# Patient Record
Sex: Female | Born: 1940 | Race: White | Hispanic: No | Marital: Married | Smoking: Never smoker
Health system: Southern US, Community
[De-identification: ages and names within clinical notes are randomized; demographics above are authoritative.]

## PROBLEM LIST (undated history)

## (undated) DIAGNOSIS — F329 Major depressive disorder, single episode, unspecified: Secondary | ICD-10-CM

## (undated) DIAGNOSIS — M1990 Unspecified osteoarthritis, unspecified site: Secondary | ICD-10-CM

## (undated) DIAGNOSIS — F32A Depression, unspecified: Secondary | ICD-10-CM

## (undated) HISTORY — PX: BREAST SURGERY: SHX581

## (undated) HISTORY — PX: TONSILLECTOMY: SUR1361

## (undated) HISTORY — DX: Major depressive disorder, single episode, unspecified: F32.9

## (undated) HISTORY — DX: Depression, unspecified: F32.A

## (undated) HISTORY — DX: Unspecified osteoarthritis, unspecified site: M19.90

---

## 2005-08-28 ENCOUNTER — Ambulatory Visit: Payer: Self-pay | Admitting: Internal Medicine

## 2005-11-02 ENCOUNTER — Ambulatory Visit: Payer: Self-pay | Admitting: Internal Medicine

## 2005-11-09 ENCOUNTER — Ambulatory Visit: Payer: Self-pay | Admitting: Internal Medicine

## 2005-11-09 ENCOUNTER — Other Ambulatory Visit: Admission: RE | Admit: 2005-11-09 | Discharge: 2005-11-09 | Payer: Self-pay | Admitting: Internal Medicine

## 2005-11-09 ENCOUNTER — Encounter: Payer: Self-pay | Admitting: Internal Medicine

## 2005-11-09 ENCOUNTER — Ambulatory Visit: Payer: Self-pay | Admitting: Oncology

## 2005-11-27 ENCOUNTER — Ambulatory Visit: Payer: Self-pay | Admitting: Internal Medicine

## 2005-12-27 ENCOUNTER — Ambulatory Visit: Payer: Self-pay | Admitting: Oncology

## 2006-01-25 ENCOUNTER — Ambulatory Visit (HOSPITAL_COMMUNITY): Admission: RE | Admit: 2006-01-25 | Discharge: 2006-01-25 | Payer: Self-pay | Admitting: Oncology

## 2006-03-01 ENCOUNTER — Ambulatory Visit: Payer: Self-pay | Admitting: Oncology

## 2006-03-15 LAB — CBC WITH DIFFERENTIAL/PLATELET
BASO%: 1.8 % (ref 0.0–2.0)
Basophils Absolute: 0 10*3/uL (ref 0.0–0.1)
EOS%: 3.9 % (ref 0.0–7.0)
HCT: 35.6 % (ref 34.8–46.6)
MCHC: 35.7 g/dL (ref 32.0–36.0)
MCV: 85.3 fL (ref 81.0–101.0)
MONO#: 0.4 10*3/uL (ref 0.1–0.9)
NEUT#: 0.2 10*3/uL — CL (ref 1.5–6.5)
NEUT%: 13 % — ABNORMAL LOW (ref 39.6–76.8)
Platelets: 158 10*3/uL (ref 145–400)
RDW: 13 % (ref 11.3–14.5)
lymph#: 0.9 10*3/uL (ref 0.9–3.3)

## 2006-04-02 LAB — CBC WITH DIFFERENTIAL/PLATELET
BASO%: 0.5 % (ref 0.0–2.0)
EOS%: 1.5 % (ref 0.0–7.0)
HGB: 13.2 g/dL (ref 11.6–15.9)
MCH: 29.5 pg (ref 26.0–34.0)
MCHC: 34.6 g/dL (ref 32.0–36.0)
MCV: 85.1 fL (ref 81.0–101.0)
MONO%: 23.3 % — ABNORMAL HIGH (ref 0.0–13.0)
RBC: 4.47 10*6/uL (ref 3.70–5.32)
RDW: 13.5 % (ref 11.3–14.5)
lymph#: 1.4 10*3/uL (ref 0.9–3.3)

## 2006-04-04 LAB — CMV ABS, IGG+IGM (CYTOMEGALOVIRUS): Cytomegalovirus Ab-IgG: 0.16 IV

## 2006-04-04 LAB — EPSTEIN-BARR VIRUS VCA, IGM: EBV VCA IgM: 0.32 {ISR}

## 2006-04-04 LAB — EPSTEIN-BARR VIRUS VCA, IGG: EBV VCA IgG: 5.34 {ISR} — ABNORMAL HIGH

## 2006-04-30 ENCOUNTER — Ambulatory Visit: Payer: Self-pay | Admitting: Oncology

## 2006-05-02 LAB — CBC WITH DIFFERENTIAL/PLATELET
MCHC: 34.5 g/dL (ref 32.0–36.0)
MONO#: 0.4 10*3/uL (ref 0.1–0.9)
Platelets: 186 10*3/uL (ref 145–400)
WBC: 1.8 10*3/uL — ABNORMAL LOW (ref 3.9–10.0)

## 2006-06-07 LAB — CBC WITH DIFFERENTIAL/PLATELET
BASO%: 0.8 % (ref 0.0–2.0)
EOS%: 2.3 % (ref 0.0–7.0)
HGB: 13.3 g/dL (ref 11.6–15.9)
LYMPH%: 66.5 % — ABNORMAL HIGH (ref 14.0–48.0)
MONO%: 23.2 % — ABNORMAL HIGH (ref 0.0–13.0)
Platelets: 187 10*3/uL (ref 145–400)
RBC: 4.5 10*6/uL (ref 3.70–5.32)
lymph#: 1.4 10*3/uL (ref 0.9–3.3)

## 2006-08-15 ENCOUNTER — Ambulatory Visit: Payer: Self-pay | Admitting: Oncology

## 2006-08-16 LAB — CBC WITH DIFFERENTIAL/PLATELET
HCT: 36.9 % (ref 34.8–46.6)
HGB: 12.9 g/dL (ref 11.6–15.9)
MCH: 30.2 pg (ref 26.0–34.0)
MCV: 86.6 fL (ref 81.0–101.0)

## 2006-08-16 LAB — MANUAL DIFFERENTIAL
ALC: 1.3 10*3/uL (ref 0.9–3.3)
LYMPH: 73 % — ABNORMAL HIGH (ref 14–49)
MONO: 13 % (ref 0–14)
PLT EST: ADEQUATE
RBC Comments: NORMAL
SEG: 14 % — ABNORMAL LOW (ref 38–77)

## 2006-09-19 ENCOUNTER — Ambulatory Visit: Payer: Self-pay | Admitting: Internal Medicine

## 2007-01-06 ENCOUNTER — Ambulatory Visit: Payer: Self-pay | Admitting: Oncology

## 2007-01-09 ENCOUNTER — Encounter: Payer: Self-pay | Admitting: Internal Medicine

## 2007-01-09 LAB — CBC WITH DIFFERENTIAL/PLATELET
Basophils Absolute: 0 10*3/uL (ref 0.0–0.1)
Eosinophils Absolute: 0 10*3/uL (ref 0.0–0.5)
HGB: 13.3 g/dL (ref 11.6–15.9)
MCV: 86.2 fL (ref 81.0–101.0)
MONO%: 20.9 % — ABNORMAL HIGH (ref 0.0–13.0)
NEUT#: 0.1 10*3/uL — CL (ref 1.5–6.5)
RBC: 4.39 10*6/uL (ref 3.70–5.32)
RDW: 12.9 % (ref 11.3–14.5)
WBC: 1.6 10*3/uL — ABNORMAL LOW (ref 3.9–10.0)
lymph#: 1.1 10*3/uL (ref 0.9–3.3)

## 2007-01-09 LAB — COMPREHENSIVE METABOLIC PANEL
ALT: 19 U/L (ref 0–35)
Albumin: 4 g/dL (ref 3.5–5.2)
Alkaline Phosphatase: 50 U/L (ref 39–117)
CO2: 26 mEq/L (ref 19–32)
Potassium: 3.9 mEq/L (ref 3.5–5.3)
Sodium: 142 mEq/L (ref 135–145)
Total Bilirubin: 0.4 mg/dL (ref 0.3–1.2)
Total Protein: 7.4 g/dL (ref 6.0–8.3)

## 2007-01-09 LAB — CHCC SMEAR

## 2007-07-02 ENCOUNTER — Ambulatory Visit: Payer: Self-pay | Admitting: Oncology

## 2007-07-04 ENCOUNTER — Encounter: Payer: Self-pay | Admitting: Internal Medicine

## 2007-07-04 LAB — CBC WITH DIFFERENTIAL/PLATELET
BASO%: 0.7 % (ref 0.0–2.0)
LYMPH%: 61.2 % — ABNORMAL HIGH (ref 14.0–48.0)
MCHC: 35.8 g/dL (ref 32.0–36.0)
MCV: 84.1 fL (ref 81.0–101.0)
MONO%: 21 % — ABNORMAL HIGH (ref 0.0–13.0)
Platelets: 168 10*3/uL (ref 145–400)
RBC: 4.07 10*6/uL (ref 3.70–5.32)
RDW: 12.8 % (ref 11.3–14.5)
WBC: 2 10*3/uL — ABNORMAL LOW (ref 3.9–10.0)

## 2007-07-04 LAB — COMPREHENSIVE METABOLIC PANEL
ALT: 13 U/L (ref 0–35)
AST: 18 U/L (ref 0–37)
Alkaline Phosphatase: 59 U/L (ref 39–117)
Potassium: 3.9 mEq/L (ref 3.5–5.3)
Sodium: 143 mEq/L (ref 135–145)
Total Bilirubin: 0.4 mg/dL (ref 0.3–1.2)
Total Protein: 7.1 g/dL (ref 6.0–8.3)

## 2007-07-04 LAB — LACTATE DEHYDROGENASE: LDH: 128 U/L (ref 94–250)

## 2007-07-23 DIAGNOSIS — F329 Major depressive disorder, single episode, unspecified: Secondary | ICD-10-CM

## 2007-10-01 ENCOUNTER — Ambulatory Visit: Payer: Self-pay | Admitting: Internal Medicine

## 2007-10-01 ENCOUNTER — Telehealth (INDEPENDENT_AMBULATORY_CARE_PROVIDER_SITE_OTHER): Payer: Self-pay | Admitting: *Deleted

## 2007-10-03 ENCOUNTER — Ambulatory Visit: Payer: Self-pay | Admitting: Internal Medicine

## 2007-10-03 DIAGNOSIS — M129 Arthropathy, unspecified: Secondary | ICD-10-CM | POA: Insufficient documentation

## 2007-10-03 DIAGNOSIS — D709 Neutropenia, unspecified: Secondary | ICD-10-CM

## 2007-10-03 DIAGNOSIS — F411 Generalized anxiety disorder: Secondary | ICD-10-CM

## 2007-10-31 ENCOUNTER — Telehealth (INDEPENDENT_AMBULATORY_CARE_PROVIDER_SITE_OTHER): Payer: Self-pay | Admitting: *Deleted

## 2008-01-06 ENCOUNTER — Ambulatory Visit: Payer: Self-pay | Admitting: Oncology

## 2008-01-08 ENCOUNTER — Encounter: Payer: Self-pay | Admitting: Internal Medicine

## 2008-01-08 LAB — CBC WITH DIFFERENTIAL/PLATELET
BASO%: 1.3 % (ref 0.0–2.0)
EOS%: 1.1 % (ref 0.0–7.0)
HCT: 36 % (ref 34.8–46.6)
MCH: 28.9 pg (ref 26.0–34.0)
MCHC: 34.8 g/dL (ref 32.0–36.0)
NEUT%: 5.7 % — ABNORMAL LOW (ref 39.6–76.8)
lymph#: 1.2 10*3/uL (ref 0.9–3.3)

## 2008-01-08 LAB — CHCC SMEAR

## 2008-06-29 ENCOUNTER — Ambulatory Visit: Payer: Self-pay | Admitting: Oncology

## 2008-07-01 ENCOUNTER — Encounter: Payer: Self-pay | Admitting: Internal Medicine

## 2008-07-01 LAB — CBC WITH DIFFERENTIAL/PLATELET
BASO%: 1.8 % (ref 0.0–2.0)
EOS%: 1.8 % (ref 0.0–7.0)
Eosinophils Absolute: 0 10*3/uL (ref 0.0–0.5)
HCT: 34.7 % — ABNORMAL LOW (ref 34.8–46.6)
HGB: 12.1 g/dL (ref 11.6–15.9)
LYMPH%: 62.6 % — ABNORMAL HIGH (ref 14.0–48.0)
MCV: 85 fL (ref 81.0–101.0)
NEUT#: 0.2 10*3/uL — CL (ref 1.5–6.5)
RDW: 12.2 % (ref 11.3–14.5)
lymph#: 1.1 10*3/uL (ref 0.9–3.3)

## 2008-07-01 LAB — COMPREHENSIVE METABOLIC PANEL
ALT: 21 U/L (ref 0–35)
AST: 22 U/L (ref 0–37)
Albumin: 3.9 g/dL (ref 3.5–5.2)
CO2: 24 mEq/L (ref 19–32)
Calcium: 9.1 mg/dL (ref 8.4–10.5)

## 2008-08-11 ENCOUNTER — Telehealth: Payer: Self-pay | Admitting: Internal Medicine

## 2008-08-12 ENCOUNTER — Encounter: Payer: Self-pay | Admitting: Internal Medicine

## 2009-01-04 ENCOUNTER — Ambulatory Visit: Payer: Self-pay | Admitting: Oncology

## 2009-01-06 ENCOUNTER — Encounter: Payer: Self-pay | Admitting: Internal Medicine

## 2009-01-06 LAB — COMPREHENSIVE METABOLIC PANEL
ALT: 22 U/L (ref 0–35)
BUN: 21 mg/dL (ref 6–23)
CO2: 29 mEq/L (ref 19–32)
Chloride: 101 mEq/L (ref 96–112)
Creatinine, Ser: 0.97 mg/dL (ref 0.40–1.20)
Sodium: 135 mEq/L (ref 135–145)
Total Protein: 7.1 g/dL (ref 6.0–8.3)

## 2009-01-06 LAB — CBC WITH DIFFERENTIAL/PLATELET
Basophils Absolute: 0 10*3/uL (ref 0.0–0.1)
HGB: 12.1 g/dL (ref 11.6–15.9)
LYMPH%: 64.5 % — ABNORMAL HIGH (ref 14.0–48.0)
MCHC: 35.6 g/dL (ref 32.0–36.0)
MONO#: 0.3 10*3/uL (ref 0.1–0.9)
NEUT%: 11.8 % — ABNORMAL LOW (ref 39.6–76.8)
Platelets: 149 10*3/uL (ref 145–400)
WBC: 1.5 10*3/uL — ABNORMAL LOW (ref 3.9–10.0)
lymph#: 1 10*3/uL (ref 0.9–3.3)

## 2009-01-11 ENCOUNTER — Ambulatory Visit: Payer: Self-pay | Admitting: Internal Medicine

## 2009-01-11 DIAGNOSIS — M25519 Pain in unspecified shoulder: Secondary | ICD-10-CM

## 2009-01-19 ENCOUNTER — Encounter: Payer: Self-pay | Admitting: Internal Medicine

## 2009-02-04 ENCOUNTER — Telehealth: Payer: Self-pay | Admitting: Family Medicine

## 2009-02-24 ENCOUNTER — Encounter: Payer: Self-pay | Admitting: Internal Medicine

## 2009-04-07 ENCOUNTER — Encounter: Payer: Self-pay | Admitting: Internal Medicine

## 2009-08-09 ENCOUNTER — Encounter: Payer: Self-pay | Admitting: Internal Medicine

## 2009-08-09 ENCOUNTER — Encounter (INDEPENDENT_AMBULATORY_CARE_PROVIDER_SITE_OTHER): Payer: Self-pay | Admitting: *Deleted

## 2009-11-18 ENCOUNTER — Encounter (INDEPENDENT_AMBULATORY_CARE_PROVIDER_SITE_OTHER): Payer: Self-pay | Admitting: *Deleted

## 2010-01-03 ENCOUNTER — Ambulatory Visit: Payer: Self-pay | Admitting: Oncology

## 2010-01-05 ENCOUNTER — Encounter: Payer: Self-pay | Admitting: Internal Medicine

## 2010-01-05 LAB — CBC WITH DIFFERENTIAL/PLATELET
BASO%: 1 % (ref 0.0–2.0)
Basophils Absolute: 0 10*3/uL (ref 0.0–0.1)
HCT: 34.3 % — ABNORMAL LOW (ref 34.8–46.6)
HGB: 11.8 g/dL (ref 11.6–15.9)
LYMPH%: 63.2 % — ABNORMAL HIGH (ref 14.0–49.7)
MCH: 29.7 pg (ref 25.1–34.0)
MCV: 86.2 fL (ref 79.5–101.0)
MONO%: 25.2 % — ABNORMAL HIGH (ref 0.0–14.0)
NEUT#: 0.1 10*3/uL — CL (ref 1.5–6.5)
NEUT%: 8.2 % — ABNORMAL LOW (ref 38.4–76.8)
RDW: 14.1 % (ref 11.2–14.5)
WBC: 1.2 10*3/uL — ABNORMAL LOW (ref 3.9–10.3)

## 2010-01-05 LAB — COMPREHENSIVE METABOLIC PANEL
ALT: 13 U/L (ref 0–35)
AST: 15 U/L (ref 0–37)
Alkaline Phosphatase: 53 U/L (ref 39–117)
BUN: 20 mg/dL (ref 6–23)
Glucose, Bld: 78 mg/dL (ref 70–99)
Sodium: 140 mEq/L (ref 135–145)
Total Bilirubin: 0.3 mg/dL (ref 0.3–1.2)

## 2010-01-05 LAB — VITAMIN D 25 HYDROXY (VIT D DEFICIENCY, FRACTURES): Vit D, 25-Hydroxy: 43 ng/mL (ref 30–89)

## 2010-01-05 LAB — LACTATE DEHYDROGENASE: LDH: 127 U/L (ref 94–250)

## 2010-01-20 ENCOUNTER — Ambulatory Visit: Payer: Self-pay | Admitting: Internal Medicine

## 2010-03-28 ENCOUNTER — Ambulatory Visit: Payer: Self-pay | Admitting: Oncology

## 2010-03-29 ENCOUNTER — Encounter: Payer: Self-pay | Admitting: Internal Medicine

## 2010-03-29 LAB — CBC WITH DIFFERENTIAL/PLATELET
Eosinophils Absolute: 0 10*3/uL (ref 0.0–0.5)
HCT: 33.4 % — ABNORMAL LOW (ref 34.8–46.6)
MCH: 29.5 pg (ref 25.1–34.0)
MCHC: 34.6 g/dL (ref 31.5–36.0)
MCV: 85.3 fL (ref 79.5–101.0)
MONO#: 0.3 10*3/uL (ref 0.1–0.9)
MONO%: 31.1 % — ABNORMAL HIGH (ref 0.0–14.0)
RBC: 3.92 10*6/uL (ref 3.70–5.45)
RDW: 14 % (ref 11.2–14.5)
WBC: 0.9 10*3/uL — CL (ref 3.9–10.3)
lymph#: 0.5 10*3/uL — ABNORMAL LOW (ref 0.9–3.3)

## 2010-03-29 LAB — COMPREHENSIVE METABOLIC PANEL
Albumin: 3.5 g/dL (ref 3.5–5.2)
Alkaline Phosphatase: 50 U/L (ref 39–117)
BUN: 17 mg/dL (ref 6–23)
CO2: 29 mEq/L (ref 19–32)
Chloride: 102 mEq/L (ref 96–112)
Creatinine, Ser: 1.09 mg/dL (ref 0.40–1.20)
Glucose, Bld: 127 mg/dL — ABNORMAL HIGH (ref 70–99)
Total Protein: 7.8 g/dL (ref 6.0–8.3)

## 2010-08-02 ENCOUNTER — Encounter: Payer: Self-pay | Admitting: Internal Medicine

## 2010-08-15 ENCOUNTER — Ambulatory Visit: Payer: Self-pay | Admitting: Oncology

## 2010-08-17 ENCOUNTER — Encounter: Payer: Self-pay | Admitting: Internal Medicine

## 2010-08-17 LAB — CBC WITH DIFFERENTIAL/PLATELET
BASO%: 1 % (ref 0.0–2.0)
EOS%: 1.9 % (ref 0.0–7.0)
MCHC: 33.1 g/dL (ref 31.5–36.0)
MCV: 84.6 fL (ref 79.5–101.0)
Platelets: 143 10*3/uL — ABNORMAL LOW (ref 145–400)
RBC: 4.22 10*6/uL (ref 3.70–5.45)
RDW: 15.3 % — ABNORMAL HIGH (ref 11.2–14.5)

## 2010-08-24 ENCOUNTER — Encounter: Payer: Self-pay | Admitting: Internal Medicine

## 2011-01-03 ENCOUNTER — Ambulatory Visit: Payer: Self-pay | Admitting: Oncology

## 2011-01-04 LAB — CBC WITH DIFFERENTIAL/PLATELET
BASO%: 0.6 % (ref 0.0–2.0)
Eosinophils Absolute: 0 10*3/uL (ref 0.0–0.5)
HCT: 38.4 % (ref 34.8–46.6)
HGB: 13.1 g/dL (ref 11.6–15.9)
MCH: 28.9 pg (ref 25.1–34.0)
NEUT#: 0.1 10*3/uL — CL (ref 1.5–6.5)
NEUT%: 3.6 % — ABNORMAL LOW (ref 38.4–76.8)
Platelets: 153 10*3/uL (ref 145–400)
RBC: 4.54 10*6/uL (ref 3.70–5.45)
RDW: 13.2 % (ref 11.2–14.5)
lymph#: 1.1 10*3/uL (ref 0.9–3.3)

## 2011-01-04 LAB — COMPREHENSIVE METABOLIC PANEL
AST: 20 U/L (ref 0–37)
Albumin: 3.9 g/dL (ref 3.5–5.2)
BUN: 26 mg/dL — ABNORMAL HIGH (ref 6–23)
CO2: 28 mEq/L (ref 19–32)
Calcium: 9.4 mg/dL (ref 8.4–10.5)
Glucose, Bld: 89 mg/dL (ref 70–99)
Potassium: 4.1 mEq/L (ref 3.5–5.3)
Sodium: 142 mEq/L (ref 135–145)

## 2011-01-09 NOTE — Letter (Signed)
Summary: [facility]  [facility]   Imported By: Maryln Gottron 09/06/2010 13:04:37  _____________________________________________________________________  External Attachment:    Type:   Image     Comment:   External Document

## 2011-01-09 NOTE — Letter (Signed)
Summary: [facility]  [facility]   Imported By: Maryln Gottron 10/04/2010 10:05:46  _____________________________________________________________________  External Attachment:    Type:   Image     Comment:   External Document

## 2011-01-09 NOTE — Miscellaneous (Signed)
Summary: Flu Shot/[facility]  Flu [facility]   Imported By: Maryln Gottron 08/10/2010 14:42:58  _____________________________________________________________________  External Attachment:    Type:   Image     Comment:   External Document

## 2011-01-09 NOTE — Letter (Signed)
[facility]   Imported By: Maryln Gottron 01/24/2010 15:18:35  _____________________________________________________________________  External Attachment:    Type:   Image     Comment:   External Document

## 2011-01-09 NOTE — Letter (Signed)
[facility]   Imported By: Maryln Gottron 04/19/2010 14:35:13  _____________________________________________________________________  External Attachment:    Type:   Image     Comment:   External Document

## 2011-01-09 NOTE — Assessment & Plan Note (Signed)
Summary: EARACHE CONTINUES ON ZPAK THEN AMOX/PER DR SHUDAD CHECK WITH .Marland KitchenMarland Kitchen   Vital Signs:  Patient profile:   70 year old female Menstrual status:  postmenopausal Height:      64.5 inches Weight:      175 pounds BMI:     29.68 Temp:     98.3 degrees F oral Pulse rate:   72 / minute BP sitting:   130 / 80  (right arm) Cuff size:   regular  Vitals Entered By: Romualdo Bolk, CMA (AAMA) (January 20, 2010 3:53 PM) CC: Pt started taking a zpak for sore throat and cold on 3 weeks ago. On 2/7 he gave her amoxil for sore throat and ear pain that hurts only when she swallows. No coughing or congestion. Pt states that she did have a fever 101 2/5 but it broke. LMP - Character: unsure- est age 29 Menarche (age onset years): 47    Menstrual Status postmenopausal   History of Present Illness: Ramonica Carothers comesin after  above for sda.     Had seen her hematologist  in January who told her to take  her z pack for hoarseness and coughing illness and had gotten some  better . Then 8-9 days ago began with fever then sore  throat and right ear pain.Dr  Clelia Croft  called in amoxicillin  and she has been on this fore last  4 -5 days but is not getting better . No fever in the last few days however .     Her risk factor of  neurtopenia is still present and is on 5-8 mg of prednisone for her RA.  stable. No cp sob  minor cough.  no rash . Actually feels ok except for pain in right throat and right ear.  No NVD no rash no bleeding or petechiae.    Preventive Screening-Counseling & Management  Alcohol-Tobacco     Smoking Status: never  Caffeine-Diet-Exercise     Does Patient Exercise: yes  Current Medications (verified): 1)  Glucosamine-Msm 1500-500 Mg/75ml  Liqd (Glucosamine Hcl-Msm) 2)  Calcium 500 500 Mg  Tabs (Calcium Carbonate) 3)  Glucosamine-Chondroitin 1500-1200 Mg/63ml  Liqd (Glucosamine-Chondroitin) 4)  Multi-Vitamin   Tabs (Multiple Vitamin) 5)  Zinc 30 Mg  Tabs (Zinc) 6)   Vitamin D 1000 Unit  Tabs (Cholecalciferol) 7)  Bl Vitamin C 1000 Mg  Tabs (Ascorbic Acid) 8)  Ativan 1 Mg  Tabs (Lorazepam) .... 1/2 -1 By Mouth Two Times A Day As Needed Anxiety and 3 At Bedtime 9)  Voltaren 75 Mg Tbec (Diclofenac Sodium) .Marland Kitchen.. 1 By Mouth Two Times A Day  For Shoulder Pain 10)  Hydrocodone-Acetaminophen 5-325 Mg Tabs (Hydrocodone-Acetaminophen) .Marland Kitchen.. 1 By Mouth Q 4-6 Hours For Sever Pain 11)  Prednisone 5 Mg Tabs (Prednisone)  Allergies (verified): 1)  ! * Claritin D  Past History:  Past medical, surgical, family and social histories (including risk factors) reviewed, and no changes noted (except as noted below).  Past Medical History: Arthritis ? rheumatoid vs other  High Cholesterol Depression Neutropenia  Consults Dr. Conan Bowens  Past Surgical History: Reviewed history from 07/23/2007 and no changes required. Tonsillectomy Breast Biopsy Lumpectomy  Past History:  Care Management: Hematology:, Shadad  Rheumatology: Deveshuwar  Family History: Family History of Arthritis Father: passed away of old age-no health problems Mother: passed away of old age-no health problems Siblings: eye problems. adult on set diabetes     Social History: Occupation: Self-employed Married Never Smoked Alcohol use-no Drug use-no Regular  exercise-yes     Review of Systems       The patient complains of fever.  The patient denies anorexia, weight loss, decreased hearing, hoarseness, chest pain, dyspnea on exertion, peripheral edema, prolonged cough, hemoptysis, abdominal pain, melena, hematochezia, severe indigestion/heartburn, hematuria, difficulty walking, unusual weight change, abnormal bleeding, and angioedema.    Physical Exam  General:  alert, well-developed, well-nourished, well-hydrated, appropriate dress, and normal appearance.   Mildly hoarse Head:  normocephalic, atraumatic, and no abnormalities observed.   Eyes:  vision grossly intact.   no  discharge  Ears:  R ear normal, L ear normal, and no external deformities.   Nose:  no external deformity, no external erythema, and no nasal discharge.   Mouth:  right tonsillar fossa area with 1 + red and white exudate   left  throat without redness or lesion.   Neck:  No deformities, masses,  noted.  tender right ac node area  shoddy node   Lungs:  Normal respiratory effort, chest expands symmetrically. Lungs are clear to auscultation, no crackles or wheezes.no dullness.   Heart:  Normal rate and regular rhythm. S1 and S2 normal without gallop, murmur, click, rub or other extra sounds. Abdomen:  Bowel sounds positive,abdomen soft and non-tender without masses, organomegaly or noted. Pulses:  nl cap perfulsion Neurologic:  alert & oriented X3, strength normal in all extremities, and gait normal.   Skin:  turgor normal, color normal, no petechiae, no purpura, and no ulcerations.   Cervical Nodes:  tender R JDG area no posterior cervical adenopathy.   Axillary Nodes:  No palpable lymphadenopathy Psych:  Oriented X3, good eye contact, not anxious appearing, and not depressed appearing.   NOte from Dr Clelia Croft an 27 requested obtained and reviewed   Impression & Recommendations:  Problem # 1:  EXUDATIVE PHARYNGITIS,RIGHT (ICD-462)  seems to be localized  area of concern.. previous rx with emepiric antibiotic via her hematologist without a specific dx.   actually doing ok except for the throat.    Her updated medication list for this problem includes:    Voltaren 75 Mg Tbec (Diclofenac sodium) .Marland Kitchen... 1 by mouth two times a day  for shoulder pain    Cephalexin 500 Mg Caps (Cephalexin) .Marland Kitchen... 1 by mouth three times a day for tonsillitis on right  or as directed  Orders: Prescription Created Electronically [phone])  Problem # 2:  NEUTROPENIA NOS (ICD-288.00) felt to be autoimmune  decline Bone marrow testing and no  hx of recurrent or unusual infection.    but in osn low dose steroids for her  RA  .  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 1/2 -1 by mouth two times a day as needed anxiety and 3 at bedtime 9)  Voltaren 75 Mg Tbec (Diclofenac sodium) .Marland Kitchen.. 1 by mouth two times a day  for shoulder pain 10)  Hydrocodone-acetaminophen 5-325 Mg Tabs (Hydrocodone-acetaminophen) .Marland Kitchen.. 1 by mouth q 4-6 hours for sever pain 11)  Prednisone 5 Mg Tabs (Prednisone) 12)  Cephalexin 500 Mg Caps (Cephalexin) .Marland KitchenMarland KitchenMarland Kitchen  1 by mouth three times a day for tonsillitis on right  or as directed  Patient Instructions: 1)  you have  throat infection  on the right.   change antibiotic  and if getting too nauseous can decrease to two times a day dosing. 2)  Expect  significant improvement in the next 3-5 days . 3)  If not calll   for reevaluation.  Prescriptions: CEPHALEXIN 500 MG CAPS (CEPHALEXIN) 1 by mouth three times a day for tonsillitis on right  or as directed  #30 x 0   Entered and Authorized by:   Madelin Headings MD   Signed by:   Madelin Headings MD on 01/20/2010   Method used:   Electronically to        [id number] (retail)       [address] [place], [place]  [address]       Ph: [phone]       Fax: [phone]   RxID:   [id number]

## 2011-01-29 ENCOUNTER — Encounter: Payer: Self-pay | Admitting: Internal Medicine

## 2011-01-29 ENCOUNTER — Ambulatory Visit (INDEPENDENT_AMBULATORY_CARE_PROVIDER_SITE_OTHER): Payer: Medicare Other | Admitting: Internal Medicine

## 2011-01-29 DIAGNOSIS — D709 Neutropenia, unspecified: Secondary | ICD-10-CM

## 2011-01-29 DIAGNOSIS — H5712 Ocular pain, left eye: Secondary | ICD-10-CM | POA: Insufficient documentation

## 2011-01-29 DIAGNOSIS — H5710 Ocular pain, unspecified eye: Secondary | ICD-10-CM

## 2011-01-29 DIAGNOSIS — B029 Zoster without complications: Secondary | ICD-10-CM | POA: Insufficient documentation

## 2011-01-29 MED ORDER — VALACYCLOVIR HCL 1 G PO TABS: 1000.0000 mg | ORAL_TABLET | Freq: Three times a day (TID) | ORAL | Status: AC

## 2011-01-29 NOTE — Progress Notes (Signed)
Addended by: Tor Netters on: 01/29/2011 01:24 PM   Modules accepted: Orders

## 2011-01-29 NOTE — Assessment & Plan Note (Signed)
Noted    Not considered immunodepressed based on past hx.    And context

## 2011-01-29 NOTE — Assessment & Plan Note (Addendum)
Antivirals and  Eye check today   Call if  persistent or progressive  And pain relief needed    .     Disc with  Patient and husband..      Expectant management.

## 2011-01-29 NOTE — Progress Notes (Signed)
  Subjective:    Patient ID: Jill Moss, female    DOB: February 04, 1941, 70 y.o.   MRN: [id number]  HPI Acute visit for above. Disc also with husband Last week had discomfort left eye ? No dc vision ok and then sharp pain  left head and over the last 24 hours had had the onset of rash describe as pink area  last pm and now  blister left  Scalp   Has some tingling type sx in the area also .  No interventions .    No fever Swollen glands NVD or resp complaint. Has neutropenia  stable and no unusual infections. NO rx for this    At present.   Review of Systems Neg Cv pulm neuro  GI GU  .    Rest of ros neg or [place]     Objective:   Physical Exam WDWN in nad with faint pink rash left forehead     ... Greer scalp few vesicles left frontal area and on scalp  Midline.    HEENT: Normocephalic ;atraumatic , Eyes;  PERRL,  ? Slight faint  Pink to conjunctiva no photophobia ,,Ears: no deformities, canals nl, TM landmarks normal, Nose: no deformity or discharge  Mouth : OP clear without lesion or edema . Neck no adenopathy or pain.    Pulm Resp  quiet  Neuro facial symmetry  Otherwise non focal  Grossly  Oriented x 3 and no noted deficits in memory, attention, and speech.         Assessment & Plan:  Poss shingles early   Left face scalp    Eye sx  Unclear if could be early involvement    rec see opthal   Has appt dr Hazle Quant today at 115 to check. Neutropenia   Stable without infections  Followed by heme

## 2011-01-29 NOTE — Patient Instructions (Signed)
Take med  And   Expectant management.  Can see  Dr Hazle Quant today .   Plan fu depending on course.

## 2011-02-02 ENCOUNTER — Telehealth: Payer: Self-pay | Admitting: Internal Medicine

## 2011-02-02 ENCOUNTER — Ambulatory Visit: Payer: Medicare Other | Admitting: Internal Medicine

## 2011-02-02 MED ORDER — PREGABALIN 50 MG PO CAPS: 50.0000 mg | ORAL_CAPSULE | Freq: Three times a day (TID) | ORAL | Status: AC

## 2011-02-02 MED ORDER — HYDROCODONE-ACETAMINOPHEN 5-325 MG PO TABS: 1.0000 | ORAL_TABLET | Freq: Four times a day (QID) | ORAL | Status: DC | PRN

## 2011-02-02 NOTE — Telephone Encounter (Signed)
Pt aware and rx called into pharmacy. 

## 2011-02-02 NOTE — Telephone Encounter (Signed)
Can refill the hydrocodone for severe pain  Prn pain disp 30  Other wise : Lyrica  50 mg  Tid  Or as directed  For shingles pain   Disp 60 refill x 1  Can make her a bit drowsy  rov   Or call if this is not helping.

## 2011-02-02 NOTE — Telephone Encounter (Signed)
Pt has sch ov for today at 3:15pm. Pt is in extreme pain. Has shingles on head and severe headache. Pt is req that Dr Fabian Sharp or nurse, call pt before ov today.

## 2011-02-02 NOTE — Telephone Encounter (Signed)
Chart opened in error

## 2011-02-02 NOTE — Telephone Encounter (Signed)
Spoke to pt and she is having ago lot of pain and burning with the shingles. She can't get any relief from it. Aleve is not helping. Pt goes to [facility]. Pt cancelled her appt. Today. She needs relief from pain.

## 2011-05-30 ENCOUNTER — Other Ambulatory Visit: Payer: Self-pay | Admitting: Oncology

## 2011-05-30 ENCOUNTER — Encounter (HOSPITAL_BASED_OUTPATIENT_CLINIC_OR_DEPARTMENT_OTHER): Payer: Medicare Other | Admitting: Oncology

## 2011-05-30 DIAGNOSIS — F411 Generalized anxiety disorder: Secondary | ICD-10-CM

## 2011-05-30 DIAGNOSIS — G4700 Insomnia, unspecified: Secondary | ICD-10-CM

## 2011-05-30 DIAGNOSIS — D709 Neutropenia, unspecified: Secondary | ICD-10-CM

## 2011-05-30 LAB — CBC WITH DIFFERENTIAL/PLATELET
BASO%: 0.6 % (ref 0.0–2.0)
EOS%: 0.9 % (ref 0.0–7.0)
Eosinophils Absolute: 0 10*3/uL (ref 0.0–0.5)
HGB: 12.6 g/dL (ref 11.6–15.9)
LYMPH%: 42.8 % (ref 14.0–49.7)
MCHC: 34.6 g/dL (ref 31.5–36.0)
MONO%: 18.9 % — ABNORMAL HIGH (ref 0.0–14.0)
Platelets: 137 10*3/uL — ABNORMAL LOW (ref 145–400)
WBC: 2.1 10*3/uL — ABNORMAL LOW (ref 3.9–10.3)

## 2011-11-02 ENCOUNTER — Other Ambulatory Visit: Payer: Self-pay | Admitting: Oncology

## 2011-11-06 ENCOUNTER — Encounter: Payer: Self-pay | Admitting: *Deleted

## 2011-11-06 ENCOUNTER — Other Ambulatory Visit: Payer: Self-pay | Admitting: *Deleted

## 2011-11-06 DIAGNOSIS — F419 Anxiety disorder, unspecified: Secondary | ICD-10-CM

## 2011-11-06 MED ORDER — LORAZEPAM 1 MG PO TABS: 1.0000 mg | ORAL_TABLET | Freq: Three times a day (TID) | ORAL | Status: DC

## 2011-11-21 ENCOUNTER — Telehealth: Payer: Self-pay | Admitting: Oncology

## 2011-11-21 NOTE — Telephone Encounter (Signed)
appt info given to spouse by phone for 1.3.13 due to epic  conversion/aom

## 2011-12-07 ENCOUNTER — Encounter: Payer: Self-pay | Admitting: *Deleted

## 2011-12-13 ENCOUNTER — Other Ambulatory Visit: Payer: Self-pay

## 2011-12-13 ENCOUNTER — Other Ambulatory Visit: Payer: Self-pay | Admitting: Oncology

## 2011-12-13 ENCOUNTER — Ambulatory Visit (HOSPITAL_BASED_OUTPATIENT_CLINIC_OR_DEPARTMENT_OTHER): Payer: Medicare Other | Admitting: Oncology

## 2011-12-13 ENCOUNTER — Telehealth: Payer: Self-pay | Admitting: Oncology

## 2011-12-13 ENCOUNTER — Other Ambulatory Visit (HOSPITAL_BASED_OUTPATIENT_CLINIC_OR_DEPARTMENT_OTHER): Payer: Medicare Other | Admitting: Lab

## 2011-12-13 VITALS — BP 110/73 | HR 90 | Temp 98.6°F | Ht 64.5 in | Wt 164.9 lb

## 2011-12-13 DIAGNOSIS — D709 Neutropenia, unspecified: Secondary | ICD-10-CM

## 2011-12-13 DIAGNOSIS — F419 Anxiety disorder, unspecified: Secondary | ICD-10-CM

## 2011-12-13 DIAGNOSIS — G4700 Insomnia, unspecified: Secondary | ICD-10-CM

## 2011-12-13 DIAGNOSIS — R52 Pain, unspecified: Secondary | ICD-10-CM

## 2011-12-13 DIAGNOSIS — M1380 Other specified arthritis, unspecified site: Secondary | ICD-10-CM

## 2011-12-13 DIAGNOSIS — F411 Generalized anxiety disorder: Secondary | ICD-10-CM

## 2011-12-13 LAB — COMPREHENSIVE METABOLIC PANEL
ALT: 21 U/L (ref 0–35)
AST: 24 U/L (ref 0–37)
Alkaline Phosphatase: 53 U/L (ref 39–117)
BUN: 25 mg/dL — ABNORMAL HIGH (ref 6–23)
Creatinine, Ser: 1.25 mg/dL — ABNORMAL HIGH (ref 0.50–1.10)
Total Bilirubin: 0.3 mg/dL (ref 0.3–1.2)

## 2011-12-13 LAB — CBC WITH DIFFERENTIAL/PLATELET
BASO%: 0.6 % (ref 0.0–2.0)
Basophils Absolute: 0 10*3/uL (ref 0.0–0.1)
EOS%: 1.9 % (ref 0.0–7.0)
HGB: 13 g/dL (ref 11.6–15.9)
MCH: 28.8 pg (ref 25.1–34.0)
MCHC: 33.9 g/dL (ref 31.5–36.0)
RBC: 4.51 10*6/uL (ref 3.70–5.45)
RDW: 13.7 % (ref 11.2–14.5)
lymph#: 0.8 10*3/uL — ABNORMAL LOW (ref 0.9–3.3)
nRBC: 0 % (ref 0–0)

## 2011-12-13 MED ORDER — HYDROCODONE-ACETAMINOPHEN 5-325 MG PO TABS: 1.0000 | ORAL_TABLET | Freq: Three times a day (TID) | ORAL | Status: DC | PRN

## 2011-12-13 MED ORDER — LORAZEPAM 1 MG PO TABS: 1.0000 mg | ORAL_TABLET | Freq: Three times a day (TID) | ORAL | Status: DC

## 2011-12-13 MED ORDER — AZITHROMYCIN 250 MG PO TABS: ORAL_TABLET | ORAL | Status: AC

## 2011-12-13 NOTE — Progress Notes (Signed)
Hematology and Oncology Follow Up Visit  Jill Moss [id number] 1941/06/20 71 y.o. 12/13/2011 4:39 PM    CC: Jill Mortimer K. 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    Interim History:  Jill Moss presents today for a followup visit.  This is a very pleasant 71 year old with chronic neutropenia that really again dates back for at least the last 10 years and has been monitored now at least here for the last 6 years without any major complications associated with that.  She has not really had any recurrent sinopulmonary infections, did not have any pneumonias or bacterial infections.  However, she did have a viral infection, including herpes zoster that was treated with antiviral therapy and fully resolved at this point.  She does not have any residual postherpetic neuralgia or any pain or discomfort.  Overall Jill Moss is doing very well and has not really had any other complaints at this time.   Medications: I have reviewed the patient's current medications. Current outpatient prescriptions:Ascorbic Acid (VITAMIN C) 1000 MG tablet, Take 1,000 mg by mouth daily.  , Disp: , Rfl: ;  Calcium Carbonate (CALCIUM 500 PO), Take by mouth.  , Disp: , Rfl: ;  Cholecalciferol (VITAMIN D) 1000 UNITS capsule, Take 1,000 Units by mouth daily.  , Disp: , Rfl: ;  glucosamine-chondroitin 500-400 MG tablet, Take 1 tablet by mouth 3 (three) times daily.  , Disp: , Rfl:  LORazepam (ATIVAN) 1 MG tablet, Take 1 tablet (1 mg total) by mouth every 8 (eight) hours., Disp: 90 tablet, Rfl: 0;  MULTIPLE VITAMIN PO, Take by mouth.  , Disp: , Rfl: ;  predniSONE (DELTASONE) 5 MG tablet, Take 5 mg by mouth daily. 7mg  a  day , Disp: , Rfl: ;  Wound Cleansers (SAF-CLENS AF EX), Apply topically. 1 tablet daily , Disp: , Rfl: ;  Zinc 30 MG CAPS, Take by mouth.  , Disp: , Rfl:  HYDROcodone-acetaminophen (NORCO) 5-325 MG per tablet, Take 1 tablet by mouth every 8 (eight) hours as needed. 1 tab by mouth every 4-6 hours as needed for severe pain, Disp: 90 tablet, Rfl: 3  Allergies:  Allergies  Allergen Reactions  . Loratadine-Pseudoephedrine     Past Medical History, Surgical history, Social history, and Family History were reviewed and updated.  Review of Systems: Constitutional:  Negative for fever, chills, night sweats, anorexia, weight loss, pain. Cardiovascular: no chest pain or dyspnea on exertion Respiratory: no cough, shortness of breath, or wheezing Neurological: no TIA or stroke symptoms Dermatological: negative ENT: negative Skin: Negative. Gastrointestinal: no abdominal pain, change in bowel habits, or black or bloody stools Genito-Urinary: no dysuria, trouble voiding, or hematuria Hematological and Lymphatic: negative Breast: negative Musculoskeletal: negative Remaining ROS negative. Physical Exam: Blood pressure 110/73, pulse 90, temperature 98.6 F (37 C), temperature source Oral, height 5' 4.5" (1.638 m), weight 164 lb 14.4 oz (74.798 kg). ECOG: 0 General appearance: alert Head: Normocephalic, without obvious abnormality, atraumatic Neck: no adenopathy, no carotid bruit, no JVD, supple, symmetrical, trachea midline and thyroid not enlarged, symmetric, no tenderness/mass/nodules Lymph nodes: Cervical, supraclavicular, and axillary nodes normal. Heart:regular rate and rhythm, S1, S2 normal, no murmur, click, rub or gallop Lung:chest clear, no wheezing, rales, normal symmetric air  entry Abdomin: soft, non-tender, without masses or organomegaly EXT:no erythema, induration, or nodules   Lab Results: Lab Results  Component Value Date   WBC 1.6* 12/13/2011   HGB 13.0 12/13/2011   HCT 38.3  12/13/2011   MCV 84.9 12/13/2011   PLT 152 12/13/2011     Chemistry      Component Value Date/Time   NA 140 12/13/2011 1508   K 3.7 12/13/2011 1508   CL 101 12/13/2011 1508   CO2 31 12/13/2011 1508   BUN 25* 12/13/2011 1508   CREATININE 1.25* 12/13/2011 1508      Component Value Date/Time   CALCIUM 10.0 12/13/2011 1508   ALKPHOS 53 12/13/2011 1508   AST 24 12/13/2011 1508   ALT 21 12/13/2011 1508   BILITOT 0.3 12/13/2011 1508         Impression and Plan: A 71 year old female with the following issues:  1. 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  She has not really had any severe bacterial infections.  She has continued observation at this point and continued to refuse bone marrow biopsy at this point.  Right now I am not really seeing any deterioration in her cell lines.  However, I elect to continue to monitor her and as mentioned, ideally I would like to get a confirmation of no disease in her bone marrow at some point.  In the meantime, I have continued to give her instructions to report to me any  temperature of 101 or higher.  I have given her a prescription for Z-Pak in case she develops any illness.  2. Autoimmune arthritis.  Continues to be on prednisone.     Jill Hose, MD 1/3/20134:39 PM

## 2011-12-13 NOTE — Telephone Encounter (Signed)
gve the pt her July 2013 appt calendar °

## 2012-01-08 ENCOUNTER — Ambulatory Visit (INDEPENDENT_AMBULATORY_CARE_PROVIDER_SITE_OTHER): Payer: Medicare Other | Admitting: Internal Medicine

## 2012-01-08 ENCOUNTER — Encounter: Payer: Self-pay | Admitting: Internal Medicine

## 2012-01-08 VITALS — BP 110/70 | HR 60 | Temp 98.5°F | Wt 165.0 lb

## 2012-01-08 DIAGNOSIS — H5712 Ocular pain, left eye: Secondary | ICD-10-CM

## 2012-01-08 DIAGNOSIS — B029 Zoster without complications: Secondary | ICD-10-CM

## 2012-01-08 DIAGNOSIS — H5710 Ocular pain, unspecified eye: Secondary | ICD-10-CM

## 2012-01-08 DIAGNOSIS — D709 Neutropenia, unspecified: Secondary | ICD-10-CM

## 2012-01-08 MED ORDER — VALACYCLOVIR HCL 1 G PO TABS: 1000.0000 mg | ORAL_TABLET | Freq: Three times a day (TID) | ORAL | Status: DC

## 2012-01-08 NOTE — Patient Instructions (Signed)
Unsure if this could be early shingles but the pain seems neuritis like before the rash  We can go ahead and begin valtrex and agree with seeing eye doctor today . If  persistent or progressive we can do other evaluation .

## 2012-01-11 ENCOUNTER — Encounter: Payer: Self-pay | Admitting: Internal Medicine

## 2012-01-11 NOTE — Progress Notes (Signed)
  Subjective:    Patient ID: Jill Moss, female    DOB: 11-30-1941, 71 y.o.   MRN: [id number]  HPI Patient comes in today for SDA  For acute problem evaluation. Had onset recently of a uri without fever   That she considered a sinus infection and took z pack given to her by her hematologist and did ok. Then over the last day had onset of left eye pain and pain left scalp and upper face just like when she had shingles  . No rash at this point or vision changes  .Has eye appt today. Concern about shingles recurring.    NO fever other neuro sx .  She has had shingles in this area Before and concerned of recurrence  Review of Systems Neg cp sob new meds see  hpi  No gi sx syncope numbness  Past history family history social history reviewed in the electronic medical record.     Objective:   Physical Exam WDWN in nad   HEENT: Normocephalic ;atraumatic , Eyes;  PERRL, EOMs  Full, lids and conjunctiva cleardont see a rash at this point ,,Ears: no deformities, canals nl, TM landmarks normal, Nose: no deformity or discharge  Mouth : OP clear without lesion or edema . Scalp sensitive on left temporal frontal area  slight erythema left temple  No vesicles or papules blotchy discoloration Neck: Supple without adenopathy or masses or bruits  Neuro CN3-12 seem intact  Normal gait and station. Oriented x 3 and no noted deficits in memory, attention, and speech. .     Assessment & Plan:  Head pain acts like neuralgia and eye  Pain with hx of shingles  In this distribution in the past.  Underlying significant neutropenia followed by heme  No unusual infection  Recent self rx with z pack for URI as directed.  No ob complication otherwise  May need further eval if  persistent or progressive

## 2012-04-08 ENCOUNTER — Other Ambulatory Visit: Payer: Self-pay | Admitting: Oncology

## 2012-05-08 ENCOUNTER — Emergency Department (HOSPITAL_COMMUNITY)
Admission: EM | Admit: 2012-05-08 | Discharge: 2012-05-09 | Disposition: A | Discharge status: Home / Self Care | Payer: Medicare Other | Attending: Emergency Medicine | Admitting: Emergency Medicine

## 2012-05-08 ENCOUNTER — Encounter (HOSPITAL_COMMUNITY): Payer: Self-pay | Admitting: Emergency Medicine

## 2012-05-08 DIAGNOSIS — Z203 Contact with and (suspected) exposure to rabies: Secondary | ICD-10-CM | POA: Insufficient documentation

## 2012-05-08 DIAGNOSIS — W5501XA Bitten by cat, initial encounter: Secondary | ICD-10-CM

## 2012-05-08 DIAGNOSIS — M79609 Pain in unspecified limb: Secondary | ICD-10-CM | POA: Insufficient documentation

## 2012-05-08 DIAGNOSIS — IMO0001 Reserved for inherently not codable concepts without codable children: Secondary | ICD-10-CM | POA: Insufficient documentation

## 2012-05-08 DIAGNOSIS — Z23 Encounter for immunization: Secondary | ICD-10-CM | POA: Insufficient documentation

## 2012-05-08 DIAGNOSIS — S51809A Unspecified open wound of unspecified forearm, initial encounter: Secondary | ICD-10-CM | POA: Insufficient documentation

## 2012-05-08 DIAGNOSIS — E785 Hyperlipidemia, unspecified: Secondary | ICD-10-CM | POA: Insufficient documentation

## 2012-05-08 MED ORDER — RABIES IMMUNE GLOBULIN 150 UNIT/ML IM INJ
20.0000 [IU]/kg | INJECTION | Freq: Once | INTRAMUSCULAR | Status: AC
  Administered 2012-05-08: 1500 [IU] via INTRAMUSCULAR
  Filled 2012-05-08: qty 10

## 2012-05-08 MED ORDER — AMOXICILLIN-POT CLAVULANATE 875-125 MG PO TABS: 1.0000 | ORAL_TABLET | Freq: Two times a day (BID) | ORAL | Status: DC

## 2012-05-08 MED ORDER — RABIES VACCINE, PCEC IM SUSR
1.0000 mL | Freq: Once | INTRAMUSCULAR | Status: AC
  Administered 2012-05-08: 1 mL via INTRAMUSCULAR
  Filled 2012-05-08: qty 1

## 2012-05-08 NOTE — ED Provider Notes (Signed)
History     CSN: [id number]  Arrival date & time 05/08/12  2132   First MD Initiated Contact with Patient 05/08/12 2303      11:10 PM HPI Patient reports she was bitten by a Stray cat on her right forearm this evening. Is uncertain whether the cat is up to date on it's rabies vaccine. Patient denies significant pain but was recommended to come here by pharmacist.  Patient is a 71 y.o. female presenting with animal bite. The history is provided by the patient.  Animal Bite  The incident occurred today. The incident occurred at home. There is an injury to the right forearm. The pain is mild. It is unlikely that a foreign body is present. Pertinent negatives include no numbness. She is right-handed.    Past Medical History  Diagnosis Date  . Depression   . Arthritis   . Hyperlipidemia   . Neutropenia     Past Surgical History  Procedure Date  . Breast surgery     Breast Biopsy, lumpectomy  . Tonsillectomy     Family History  Problem Relation Age of Onset  . Heart disease Sister   . Other Sister     Eye problems  . Other Sister     eye problems    History  Substance Use Topics  . Smoking status: Never Smoker   . Smokeless tobacco: Not on file  . Alcohol Use: No    OB History    Grav Para Term Preterm Abortions TAB SAB Ect Mult Living                  Review of Systems  Constitutional: Negative for fever and chills.  Skin: Positive for wound (animal bite). Negative for color change.  Neurological: Negative for numbness.  All other systems reviewed and are negative.    Allergies  Loratadine-pseudoephedrine er  Home Medications   Current Outpatient Rx  Name Route Sig Dispense Refill  . VITAMIN C 1000 MG PO TABS Oral Take 1,000 mg by mouth daily.      Marland Kitchen CALCIUM 500 PO Oral Take by mouth.      Marland Kitchen VITAMIN D 1000 UNITS PO CAPS Oral Take 1,000 Units by mouth daily.      Marland Kitchen GLUCOSAMINE-CHONDROITIN 500-400 MG PO TABS Oral Take 1 tablet by mouth 3 (three) times  daily.      Marland Kitchen LORAZEPAM 1 MG PO TABS      . ADULT MULTIVITAMIN W/MINERALS CH Oral Take 1 tablet by mouth daily.    Marland Kitchen PREDNISONE 5 MG PO TABS Oral Take 5 mg by mouth daily. 7mg  a day     . ZINC 30 MG PO CAPS Oral Take by mouth.        BP 117/71  Pulse 88  Temp(Src) 98.6 F (37 C) (Oral)  Resp 18  SpO2 100%  Physical Exam  Vitals reviewed. Constitutional: She is oriented to person, place, and time. Vital signs are normal. She appears well-developed and well-nourished. No distress.  HENT:  Head: Normocephalic and atraumatic.  Eyes: Pupils are equal, round, and reactive to light.  Neck: Neck supple.  Pulmonary/Chest: Effort normal.  Neurological: She is alert and oriented to person, place, and time.  Skin: Skin is warm and dry. No rash noted. No erythema. No pallor.       2 small puncture wounds on the right posterior forearm. Nontender to palpation. No surrounding erythema. Otherwise normal distal pulses, full range of motion, sensation, normal capillary  refill  Psychiatric: She has a normal mood and affect. Her behavior is normal.    ED Course  Procedures   MDM   Patient will be treated with Rabies vaccine and immune globulin also will discharge patient with Augmentin. Patient given rabies schedule and understands the importance of vaccinations.    Thomasene Lot, PA-C 05/08/12 2348

## 2012-05-08 NOTE — ED Notes (Signed)
Patient reports cat bite on right forearm

## 2012-05-08 NOTE — Discharge Instructions (Signed)
Animal Bite  An animal bite can result in a scratch on the skin, deep open cut, puncture of the skin, crush injury, or tearing away of the skin or a body part. Dogs are responsible for most animal bites. Children are bitten more often than adults. An animal bite can range from very mild to more serious. A small bite from your house pet is no cause for alarm. However, some animal bites can become infected or injure a bone or other tissue. You must seek medical care if:  · The skin is broken and bleeding does not slow down or stop after 15 minutes.  · The puncture is deep and difficult to clean (such as a cat bite).  · Pain, warmth, redness, or pus develops around the wound.  · The bite is from a stray animal or rodent. There may be a risk of rabies infection.  · The bite is from a snake, raccoon, skunk, fox, coyote, or bat. There may be a risk of rabies infection.  · The person bitten has a chronic illness such as diabetes, liver disease, or cancer, or the person takes medicine that lowers the immune system.  · There is concern about the location and severity of the bite.  It is important to clean and protect an animal bite wound right away to prevent infection. Follow these steps:  · Clean the wound with plenty of water and soap.  · Apply an antibiotic cream.  · Apply gentle pressure over the wound with a clean towel or gauze to slow or stop bleeding.  · Elevate the affected area above the heart to help stop any bleeding.  · Seek medical care. Getting medical care within 8 hours of the animal bite leads to the best possible outcome.  DIAGNOSIS   Your caregiver will most likely:  · Take a detailed history of the animal and the bite injury.  · Perform a wound exam.  · Take your medical history.  Blood tests or X-rays may be performed. Sometimes, infected bite wounds are cultured and sent to a lab to identify the infectious bacteria.   TREATMENT   Medical treatment will depend on the location and type of animal bite as  well as the patient's medical history. Treatment may include:  · Wound care, such as cleaning and flushing the wound with saline solution, bandaging, and elevating the affected area.  · Antibiotics.  · Tetanus immunization.  · Rabies immunization.  · Leaving the wound open to heal. This is often done with animal bites, due to the high risk of infection. However, in certain cases, wound closure with stitches, wound adhesive, skin adhesive strips, or staples may be used.   Infected bites that are left untreated may require intravenous (IV) antibiotics and surgical treatment in the hospital.  HOME CARE INSTRUCTIONS  · Follow your caregiver's instructions for wound care.  · Take all medicines as directed.  · If your caregiver prescribes antibiotics, take them as directed. Finish them even if you start to feel better.  · Follow up with your caregiver for further exams or immunizations as directed.  You may need a tetanus shot if:  · You cannot remember when you had your last tetanus shot.  · You have never had a tetanus shot.  · The injury broke your skin.  If you get a tetanus shot, your arm may swell, get red, and feel warm to the touch. This is common and not a problem. If you need a tetanus   shot and you choose not to have one, there is a rare chance of getting tetanus. Sickness from tetanus can be serious.  SEEK MEDICAL CARE IF:  · You notice warmth, redness, soreness, swelling, pus discharge, or a bad smell coming from the wound.  · You have a red line on the skin coming from the wound.  · You have a fever, chills, or a general ill feeling.  · You have nausea or vomiting.  · You have continued or worsening pain.  · You have trouble moving the injured part.  · You have other questions or concerns.  MAKE SURE YOU:  · Understand these instructions.  · Will watch your condition.  · Will get help right away if you are not doing well or get worse.  Document Released: 08/14/2011 Document Revised: 11/15/2011 Document  Reviewed: 08/14/2011  Exit[employer]® Patient Information ©2012 [employer].

## 2012-05-09 NOTE — ED Provider Notes (Signed)
Medical screening examination/treatment/procedure(s) were performed by non-physician practitioner and as supervising physician I was immediately available for consultation/collaboration.   Lyanne Co, MD 05/09/12 Marlyne Beards

## 2012-05-09 NOTE — ED Notes (Signed)
Faxed Rabies schedule [facility], and Pharmacy.  I called and spoke with pt and gave her the number to [facility] and the scheduled dates for her f/u shots.  Pt verbally expressed understanding.  

## 2012-05-09 NOTE — ED Notes (Signed)
Patient discharge via ambulatory with steady gait. Respirations equal and unlabored. Skin warm and dry. No acute distress noted. 

## 2012-05-11 ENCOUNTER — Encounter (HOSPITAL_COMMUNITY): Payer: Self-pay

## 2012-05-11 ENCOUNTER — Emergency Department (HOSPITAL_COMMUNITY)
Admission: EM | Admit: 2012-05-11 | Discharge: 2012-05-11 | Disposition: A | Discharge status: Home / Self Care | Payer: Medicare Other | Source: Home / Self Care

## 2012-05-11 MED ORDER — RABIES VACCINE, PCEC IM SUSR
1.0000 mL | Freq: Once | INTRAMUSCULAR | Status: AC
  Administered 2012-05-11: 1 mL via INTRAMUSCULAR

## 2012-05-11 MED ORDER — RABIES VACCINE, PCEC IM SUSR
INTRAMUSCULAR | Status: AC
  Filled 2012-05-11: qty 1

## 2012-05-11 NOTE — ED Notes (Signed)
Here for vaccine

## 2012-05-11 NOTE — Discharge Instructions (Signed)
Please call [phone] to schedule additional rabies vaccinations.

## 2012-05-15 ENCOUNTER — Emergency Department (INDEPENDENT_AMBULATORY_CARE_PROVIDER_SITE_OTHER)
Admission: EM | Admit: 2012-05-15 | Discharge: 2012-05-15 | Disposition: A | Discharge status: Home / Self Care | Payer: Medicare Other | Source: Home / Self Care

## 2012-05-15 ENCOUNTER — Encounter (HOSPITAL_COMMUNITY): Payer: Self-pay | Admitting: *Deleted

## 2012-05-15 DIAGNOSIS — Z23 Encounter for immunization: Secondary | ICD-10-CM

## 2012-05-15 MED ORDER — RABIES VACCINE, PCEC IM SUSR
INTRAMUSCULAR | Status: AC
  Filled 2012-05-15: qty 1

## 2012-05-15 MED ORDER — RABIES VACCINE, PCEC IM SUSR
1.0000 mL | Freq: Once | INTRAMUSCULAR | Status: AC
  Administered 2012-05-15: 1 mL via INTRAMUSCULAR

## 2012-05-15 NOTE — Discharge Instructions (Signed)
Call if any problems. Return 6/13 @ 1545 for next vaccine. Ask Dr. Juliette Alcide if you need day 28 of the series that is for pt.'s that are immune compromised.

## 2012-05-15 NOTE — ED Notes (Signed)
Pt. Here for third rabies vaccine for cat bite to R forearm. Puncture wounds healed.  No signs of infections. No problems from previous injections. Jill Moss 05/15/2012

## 2012-05-22 ENCOUNTER — Emergency Department (INDEPENDENT_AMBULATORY_CARE_PROVIDER_SITE_OTHER)
Admission: EM | Admit: 2012-05-22 | Discharge: 2012-05-22 | Disposition: A | Discharge status: Home / Self Care | Payer: Medicare Other | Source: Home / Self Care | Attending: Family Medicine | Admitting: Family Medicine

## 2012-05-22 ENCOUNTER — Encounter (HOSPITAL_COMMUNITY): Payer: Self-pay | Admitting: *Deleted

## 2012-05-22 DIAGNOSIS — Z23 Encounter for immunization: Secondary | ICD-10-CM

## 2012-05-22 MED ORDER — RABIES VACCINE, PCEC IM SUSR
1.0000 mL | Freq: Once | INTRAMUSCULAR | Status: AC
Start: 2012-05-22 — End: 2012-05-22
  Administered 2012-05-22: 1 mL via INTRAMUSCULAR

## 2012-05-22 MED ORDER — RABIES VACCINE, PCEC IM SUSR
INTRAMUSCULAR | Status: AC
  Filled 2012-05-22: qty 1

## 2012-05-22 NOTE — ED Notes (Addendum)
Here for last rabies vaccine for cat bite.  C/o joint pain in her hands, rash on hands and arms with itching, bad tempered, palpatations, not sleeping well at night because of it and dizziness.  Rash appears like poison ivy.

## 2012-05-22 NOTE — Discharge Instructions (Signed)
Congratulations, you have finished your rabies series.  Call if any problems.  If any further rabies exposures, go to the ED and get a rabies titer drawn.  If it is low you would need a booster shot.  Buy Hydrocortisone cream and Caladryl clear for itching.  Use ice as needed.

## 2012-06-04 ENCOUNTER — Other Ambulatory Visit: Payer: Self-pay | Admitting: Oncology

## 2012-06-06 ENCOUNTER — Other Ambulatory Visit: Payer: Self-pay | Admitting: *Deleted

## 2012-06-06 NOTE — Telephone Encounter (Signed)
Called [facility] on [address] and refilled ativan, per dr Clelia Croft. Patient notified.

## 2012-07-02 ENCOUNTER — Other Ambulatory Visit: Payer: Self-pay | Admitting: Oncology

## 2012-07-02 DIAGNOSIS — M129 Arthropathy, unspecified: Secondary | ICD-10-CM

## 2012-07-03 ENCOUNTER — Telehealth: Payer: Self-pay | Admitting: Oncology

## 2012-07-03 ENCOUNTER — Ambulatory Visit (HOSPITAL_BASED_OUTPATIENT_CLINIC_OR_DEPARTMENT_OTHER): Payer: Medicare Other | Admitting: Oncology

## 2012-07-03 ENCOUNTER — Other Ambulatory Visit (HOSPITAL_BASED_OUTPATIENT_CLINIC_OR_DEPARTMENT_OTHER): Payer: Medicare Other | Admitting: Lab

## 2012-07-03 VITALS — BP 112/72 | HR 93 | Temp 97.3°F | Ht 64.0 in | Wt 163.3 lb

## 2012-07-03 DIAGNOSIS — E559 Vitamin D deficiency, unspecified: Secondary | ICD-10-CM

## 2012-07-03 DIAGNOSIS — D709 Neutropenia, unspecified: Secondary | ICD-10-CM

## 2012-07-03 DIAGNOSIS — M129 Arthropathy, unspecified: Secondary | ICD-10-CM

## 2012-07-03 LAB — CBC WITH DIFFERENTIAL/PLATELET
Basophils Absolute: 0 10*3/uL (ref 0.0–0.1)
Eosinophils Absolute: 0 10*3/uL (ref 0.0–0.5)
HGB: 12.2 g/dL (ref 11.6–15.9)
MCV: 86.7 fL (ref 79.5–101.0)
MONO%: 20.3 % — ABNORMAL HIGH (ref 0.0–14.0)
NEUT#: 0.3 10*3/uL — CL (ref 1.5–6.5)
Platelets: 153 10*3/uL (ref 145–400)
RDW: 12.7 % (ref 11.2–14.5)

## 2012-07-03 LAB — COMPREHENSIVE METABOLIC PANEL
Albumin: 3.5 g/dL (ref 3.5–5.2)
Alkaline Phosphatase: 49 U/L (ref 39–117)
BUN: 22 mg/dL (ref 6–23)
CO2: 26 mEq/L (ref 19–32)
Calcium: 9.5 mg/dL (ref 8.4–10.5)
Chloride: 98 mEq/L (ref 96–112)
Glucose, Bld: 131 mg/dL — ABNORMAL HIGH (ref 70–99)
Potassium: 3.6 mEq/L (ref 3.5–5.3)

## 2012-07-03 LAB — CHCC SMEAR

## 2012-07-03 MED ORDER — LORAZEPAM 1 MG PO TABS: 1.0000 mg | ORAL_TABLET | Freq: Three times a day (TID) | ORAL | Status: DC

## 2012-07-03 NOTE — Progress Notes (Signed)
Hematology and Oncology Follow Up Visit  Jill Moss [id number] 1941-10-08 71 y.o. 07/03/2012 3:46 PM    CC: Jill Mortimer K. 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   Interim History:  Jill Moss presents today for a followup visit.  This is a very pleasant 71 year old with chronic neutropenia that really again dates back for at least the last 10 years and has been monitored now at least here for the last 6 years without any major complications associated with that.  She has not really had any recurrent sinopulmonary infections, did not have any pneumonias or bacterial infections.  However, she did have a viral infection, including herpes zoster that was treated with antiviral therapy and fully resolved at this point.  She does not have any residual postherpetic neuralgia or any pain or discomfort.  Overall Jill Moss is doing very well and has not really had any other complaints at this time. She was bit by a raccoon and received rabbis vaccine.    Medications: I have reviewed the patient's current medications. Current outpatient prescriptions:Ascorbic Acid (VITAMIN C) 1000 MG tablet, Take 1,000 mg by mouth daily.  , Disp: , Rfl: ;  Calcium Carbonate (CALCIUM 500 PO), Take by mouth.  , Disp: , Rfl: ;  Cholecalciferol (VITAMIN D) 1000 UNITS capsule, Take 1,000 Units by mouth daily.  , Disp: , Rfl: ;  glucosamine-chondroitin 500-400 MG tablet, Take 1 tablet by mouth 3 (three) times daily.  , Disp: , Rfl:  LORazepam (ATIVAN) 1 MG tablet, Take 1 tablet (1 mg total) by mouth every 8 (eight) hours., Disp: 270 tablet, Rfl: 3;  Multiple Vitamin (MULITIVITAMIN WITH MINERALS) TABS, Take 1  tablet by mouth daily., Disp: , Rfl: ;  predniSONE (DELTASONE) 5 MG tablet, Take 5 mg by mouth daily. 7mg  a day , Disp: , Rfl: ;  Zinc 30 MG CAPS, Take by mouth.  , Disp: , Rfl:  DISCONTD: LORazepam (ATIVAN) 1 MG tablet, TAKE ONE TABLET BY MOUTH EVERY 8 HOURS AS NEEDED, Disp: 90 tablet, Rfl: 3  Allergies:  No Active Allergies  Past Medical History, Surgical history, Social history, and Family History were reviewed and updated.  Review of Systems: Constitutional:  Negative for fever, chills, night sweats, anorexia, weight loss, pain. Cardiovascular: no chest pain or dyspnea on exertion Respiratory: no cough, shortness of breath, or wheezing Neurological: no TIA or stroke symptoms Dermatological: negative ENT: negative Skin: Negative. Gastrointestinal: no abdominal pain, change in bowel habits, or black or bloody stools Genito-Urinary: no dysuria, trouble voiding, or hematuria Hematological and Lymphatic: negative Breast: negative Musculoskeletal: negative Remaining ROS negative. Physical Exam: Blood pressure 112/72, pulse 93, temperature 97.3 F (36.3 C), temperature source Oral, height 5\' 4"  (1.626 m), weight 163 lb 4.8 oz (74.072 kg). ECOG: 0 General appearance: alert Head: Normocephalic, without obvious abnormality, atraumatic Neck: no adenopathy, no carotid bruit, no JVD, supple, symmetrical, trachea midline and thyroid not enlarged, symmetric, no tenderness/mass/nodules Lymph nodes: Cervical, supraclavicular, and axillary nodes normal. Heart:regular rate and rhythm, S1, S2 normal, no murmur, click, rub or gallop Lung:chest clear, no wheezing, rales, normal symmetric air entry Abdomin: soft, non-tender, without masses or organomegaly EXT:no erythema, induration, or nodules   Lab Results: Lab Results  Component Value Date   WBC 1.3* 07/03/2012   HGB 12.2 07/03/2012   HCT 36.0 07/03/2012   MCV 86.7 07/03/2012   PLT 153 07/03/2012     Chemistry      Component Value Date/Time    NA 136 07/03/2012 1459   K 3.6 07/03/2012 1459   CL 98 07/03/2012 1459   CO2 26 07/03/2012 1459   BUN 22 07/03/2012 1459   CREATININE 0.92 07/03/2012 1459      Component Value Date/Time   CALCIUM 9.5 07/03/2012 1459   ALKPHOS 49 07/03/2012 1459   AST 22 07/03/2012 1459   ALT 13 07/03/2012 1459   BILITOT 0.3 07/03/2012 1459       Impression and Plan: A 71 year old female with the following issues:  1. 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  She has not really had any severe bacterial infections.  She has continued observation at this point and continued to refuse bone marrow biopsy at this point.  Right now I am not really seeing any deterioration in her cell lines.  However, I elect to continue to monitor her and as mentioned, ideally I would like to get a confirmation of no disease in her bone marrow at some point.  In the meantime, I have continued to give her instructions to report to me any temperature of 101 or higher.  I have given her a prescription for Z-Pak in case she develops any illness.  2. Autoimmune arthritis.  Continues to be on prednisone. 3. Insomnia and anxiety: proscription given today.      [facility], MD 7/25/20133:46 PM

## 2012-07-03 NOTE — Telephone Encounter (Signed)
Gave pt appt for February 28th per pt request, lab and MD

## 2012-07-04 LAB — VITAMIN D 25 HYDROXY (VIT D DEFICIENCY, FRACTURES): Vit D, 25-Hydroxy: 53 ng/mL (ref 30–89)

## 2013-01-02 ENCOUNTER — Other Ambulatory Visit: Payer: Self-pay | Admitting: Oncology

## 2013-02-06 ENCOUNTER — Telehealth: Payer: Self-pay | Admitting: Oncology

## 2013-02-06 ENCOUNTER — Other Ambulatory Visit (HOSPITAL_BASED_OUTPATIENT_CLINIC_OR_DEPARTMENT_OTHER): Payer: Medicare Other | Admitting: Lab

## 2013-02-06 ENCOUNTER — Ambulatory Visit (HOSPITAL_BASED_OUTPATIENT_CLINIC_OR_DEPARTMENT_OTHER): Payer: Medicare Other | Admitting: Oncology

## 2013-02-06 VITALS — BP 125/79 | HR 83 | Temp 97.1°F | Resp 20 | Ht 64.0 in | Wt 178.0 lb

## 2013-02-06 DIAGNOSIS — D709 Neutropenia, unspecified: Secondary | ICD-10-CM

## 2013-02-06 DIAGNOSIS — G4700 Insomnia, unspecified: Secondary | ICD-10-CM

## 2013-02-06 DIAGNOSIS — M1380 Other specified arthritis, unspecified site: Secondary | ICD-10-CM

## 2013-02-06 LAB — CBC WITH DIFFERENTIAL/PLATELET
BASO%: 0.9 % (ref 0.0–2.0)
EOS%: 1.5 % (ref 0.0–7.0)
MCHC: 34 g/dL (ref 31.5–36.0)
MONO#: 0.4 10*3/uL (ref 0.1–0.9)
RBC: 4.27 10*6/uL (ref 3.70–5.45)
RDW: 13.3 % (ref 11.2–14.5)
WBC: 2.5 10*3/uL — ABNORMAL LOW (ref 3.9–10.3)
lymph#: 0.8 10*3/uL — ABNORMAL LOW (ref 0.9–3.3)

## 2013-02-06 LAB — COMPREHENSIVE METABOLIC PANEL (CC13)
ALT: 15 U/L (ref 0–55)
AST: 20 U/L (ref 5–34)
CO2: 28 mEq/L (ref 22–29)
Calcium: 9.5 mg/dL (ref 8.4–10.4)
Chloride: 101 mEq/L (ref 98–107)
Sodium: 139 mEq/L (ref 136–145)
Total Protein: 7.5 g/dL (ref 6.4–8.3)

## 2013-02-06 MED ORDER — AZITHROMYCIN 250 MG PO TABS: ORAL_TABLET | ORAL | Status: DC

## 2013-02-06 NOTE — Telephone Encounter (Signed)
Gave pt appt for August 2014 lab and MD °

## 2013-02-06 NOTE — Progress Notes (Signed)
Hematology and Oncology Follow Up Visit  Libi Corso [id number] 27-Sep-1941 72 y.o. 02/06/2013 12:06 PM    CC: [name] K. 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 for the last 8 years or so..   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  Interim History:  Mrs. Fahs presents today for a followup visit.  This is a very pleasant 72 year old with chronic neutropenia that really again dates back for at least the last 10 years and has been monitored now at least here for the last 6 years without any major complications associated with that.  She has not really had any recurrent sinopulmonary infections, did not have any pneumonias or bacterial infections.  However, she did have a viral infection with sore throat.  Overall Ms. Tayloe is doing very well and has not really had any other complaints at this time and recently traveled to her home land of [place].   Medications: I have reviewed the patient's current medications. Current outpatient prescriptions:Ascorbic Acid (VITAMIN C) 1000 MG tablet, Take 1,000 mg by mouth daily.  , Disp: , Rfl: ;  Calcium Carbonate (CALCIUM 500 PO), Take by mouth.  , Disp: , Rfl: ;  glucosamine-chondroitin 500-400 MG tablet, Take 1 tablet by mouth 3 (three) times daily.  , Disp: , Rfl: ;  Krill Oil CAPS, Take 1 each by mouth daily., Disp: , Rfl:  LORazepam (ATIVAN) 1 MG tablet, TAKE 1 TABLET BY MOUTH EVERY 8 HOURS, Disp: 270 tablet, Rfl: 0;  Multiple Vitamin (MULITIVITAMIN WITH MINERALS) TABS, Take 1 tablet by mouth daily., Disp: , Rfl: ;  predniSONE (DELTASONE) 5 MG tablet, Take 7 mg by mouth daily. 7mg  a day, Disp: , Rfl: ;  azithromycin (ZITHROMAX Z-PAK) 250 MG tablet, Take tow tablets on the first day. Then one daily  for 4 days., Disp: 6 each, Rfl: 1  Allergies:  No Active Allergies  Past Medical History, Surgical history, Social history, and Family History were reviewed and updated.  Review of Systems: Constitutional:  Negative for fever, chills, night sweats, anorexia, weight loss, pain. Cardiovascular: no chest pain or dyspnea on exertion Respiratory: no cough, shortness of breath, or wheezing Neurological: no TIA or stroke symptoms Dermatological: negative ENT: negative Skin: Negative. Gastrointestinal: no abdominal pain, change in bowel habits, or black or bloody stools Genito-Urinary: no dysuria, trouble voiding, or hematuria Hematological and Lymphatic: negative Breast: negative Musculoskeletal: negative Remaining ROS negative. Physical Exam: Blood pressure 125/79, pulse 83, temperature 97.1 F (36.2 C), temperature source Oral, resp. rate 20, height 5\' 4"  (1.626 m), weight 178 lb (80.74 kg). ECOG: 0 General appearance: alert Head: Normocephalic, without obvious abnormality, atraumatic Neck: no adenopathy, no carotid bruit, no JVD, supple, symmetrical, trachea midline and thyroid not enlarged, symmetric, no tenderness/mass/nodules Lymph nodes: Cervical, supraclavicular, and axillary nodes normal. Heart:regular rate and rhythm, S1, S2 normal, no murmur, click, rub or gallop Lung:chest clear, no wheezing, rales, normal symmetric air entry Abdomin: soft, non-tender, without masses or organomegaly EXT:no erythema, induration, or nodules   Lab Results: Lab Results  Component Value Date   WBC 2.5* 02/06/2013   HGB 12.5 02/06/2013   HCT 36.7 02/06/2013   MCV 85.9 02/06/2013   PLT 173 02/06/2013     Chemistry      Component Value Date/Time   NA  139 02/06/2013 1117   NA 136 07/03/2012 1459   K 3.9 02/06/2013 1117   K 3.6 07/03/2012 1459   CL 101 02/06/2013 1117   CL 98 07/03/2012 1459   CO2 28 02/06/2013 1117   CO2 26 07/03/2012 1459   BUN 21.9 02/06/2013 1117   BUN 22 07/03/2012 1459    CREATININE 1.1 02/06/2013 1117   CREATININE 0.92 07/03/2012 1459      Component Value Date/Time   CALCIUM 9.5 02/06/2013 1117   CALCIUM 9.5 07/03/2012 1459   ALKPHOS 43 02/06/2013 1117   ALKPHOS 49 07/03/2012 1459   AST 20 02/06/2013 1117   AST 22 07/03/2012 1459   ALT 15 02/06/2013 1117   ALT 13 07/03/2012 1459   BILITOT 0.43 02/06/2013 1117   BILITOT 0.3 07/03/2012 1459       Impression and Plan: A 72 year old female with the following issues:  1. 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  She has not really had any severe bacterial infections.  Her counts are better today with ANC close to 1200. She has continued observation at this point and continued to refuse bone marrow biopsy at this point.  Right now I am not really seeing any deterioration in her cell lines. Ideally I would like to get a confirmation of no disease in her bone marrow at some point but she has refused as well.  In the meantime, I have continued to give her instructions to report to me any temperature of 101 or higher.  I have given her a prescription for Z-Pak in case she develops any illness.  2. Autoimmune arthritis.  Continues to be on prednisone. 3. Insomnia and anxiety: on Ativan.      [facility], MD 2/28/201412:06 PM

## 2013-03-31 ENCOUNTER — Other Ambulatory Visit: Payer: Self-pay | Admitting: Oncology

## 2013-04-02 ENCOUNTER — Other Ambulatory Visit: Payer: Self-pay | Admitting: *Deleted

## 2013-04-02 MED ORDER — LORAZEPAM 1 MG PO TABS: 0.5000 mg | ORAL_TABLET | Freq: Three times a day (TID) | ORAL | Status: DC | PRN

## 2013-05-25 ENCOUNTER — Telehealth: Payer: Self-pay | Admitting: Oncology

## 2013-06-10 ENCOUNTER — Telehealth: Payer: Self-pay | Admitting: Oncology

## 2013-06-10 ENCOUNTER — Ambulatory Visit (HOSPITAL_BASED_OUTPATIENT_CLINIC_OR_DEPARTMENT_OTHER): Payer: Medicare Other | Admitting: Oncology

## 2013-06-10 ENCOUNTER — Other Ambulatory Visit (HOSPITAL_BASED_OUTPATIENT_CLINIC_OR_DEPARTMENT_OTHER): Payer: Medicare Other | Admitting: Lab

## 2013-06-10 VITALS — BP 112/78 | HR 104 | Temp 98.0°F | Resp 20 | Ht 64.0 in | Wt 174.7 lb

## 2013-06-10 DIAGNOSIS — D709 Neutropenia, unspecified: Secondary | ICD-10-CM

## 2013-06-10 LAB — COMPREHENSIVE METABOLIC PANEL (CC13)
ALT: 31 U/L (ref 0–55)
AST: 23 U/L (ref 5–34)
Alkaline Phosphatase: 46 U/L (ref 40–150)
CO2: 27 mEq/L (ref 22–29)
Creatinine: 1.2 mg/dL — ABNORMAL HIGH (ref 0.6–1.1)
Sodium: 140 mEq/L (ref 136–145)
Total Bilirubin: 0.33 mg/dL (ref 0.20–1.20)
Total Protein: 7.7 g/dL (ref 6.4–8.3)

## 2013-06-10 LAB — CBC WITH DIFFERENTIAL/PLATELET
BASO%: 0.7 % (ref 0.0–2.0)
EOS%: 1.8 % (ref 0.0–7.0)
LYMPH%: 65 % — ABNORMAL HIGH (ref 14.0–49.7)
MCH: 30.2 pg (ref 25.1–34.0)
MCHC: 35.4 g/dL (ref 31.5–36.0)
MONO#: 0.6 10*3/uL (ref 0.1–0.9)
NEUT%: 1.9 % — ABNORMAL LOW (ref 38.4–76.8)
Platelets: 173 10*3/uL (ref 145–400)
RBC: 4.2 10*6/uL (ref 3.70–5.45)
WBC: 1.9 10*3/uL — ABNORMAL LOW (ref 3.9–10.3)
lymph#: 1.2 10*3/uL (ref 0.9–3.3)

## 2013-06-10 MED ORDER — LORAZEPAM 1 MG PO TABS: 0.5000 mg | ORAL_TABLET | Freq: Three times a day (TID) | ORAL | Status: DC | PRN

## 2013-06-10 MED ORDER — AZITHROMYCIN 250 MG PO TABS: ORAL_TABLET | ORAL | Status: DC

## 2013-06-10 NOTE — Telephone Encounter (Signed)
gv pt appt schedule for March 2015.  °

## 2013-06-10 NOTE — Progress Notes (Signed)
Hematology and Oncology Follow Up Visit  Jill Moss [id number] 23-Aug-1941 72 y.o. 06/10/2013 9:43 AM    CC: Jill Mends. Fabian Sharp, MD  [name], MD    Principle Diagnosis: This is a 72 year old female with chronic neutropenia with differential diagnosis:  Autoimmune versus idiopathic diagnosed about 10 years ago, had been followed here at the [facility] for the last 8 years or so.   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  Interim History:  Jill Moss presents today for a followup visit.  This is a very pleasant 72 year old with chronic neutropenia that really again dates back for at least the last 10 years and has been monitored now at least here for the last 8 years without any major complications associated with that.  She has not really had any recurrent sinopulmonary infections, did not have any pneumonias or bacterial infections.  However, she did have a viral infection with sore throat.  Overall Jill Moss is doing very well and has not really had any other complaints. She is reporting more knee and neck pain and her steroids were increased since the last visit.   Medications: I have reviewed the patient's current medications.  Current Outpatient Prescriptions  Medication Sig Dispense Refill  . Ascorbic Acid (VITAMIN C) 1000 MG tablet Take 1,000 mg by mouth daily.        Marland Kitchen azithromycin (ZITHROMAX Z-PAK) 250 MG tablet Take tow tablets on the first day. Then one daily for 4 days.  6 each  1  . Calcium Carbonate (CALCIUM 500 PO) Take by mouth.        . diphenhydramine-acetaminophen (TYLENOL PM) 25-500 MG TABS Take 1 tablet by mouth at bedtime as needed. Patient takes 1/2 tablet at bedtime      . glucosamine-chondroitin 500-400 MG tablet Take 1 tablet by mouth 3 (three) times daily.        [place] Lanius CAPS Take 1 each by mouth daily.      Marland Kitchen LORazepam (ATIVAN) 1 MG tablet Take 0.5 tablets (0.5 mg total) by mouth every 8  (eight) hours as needed for anxiety.  270 tablet  3  . Multiple Vitamin (MULITIVITAMIN WITH MINERALS) TABS Take 1 tablet by mouth daily.      . predniSONE (DELTASONE) 5 MG tablet Take 7 mg by mouth daily. 7mg  a day       No current facility-administered medications for this visit.    Allergies:  No Known Allergies  Past Medical History, Surgical history, Social history, and Family History were reviewed and updated.  Review of Systems: Constitutional:  Negative for fever, chills, night sweats, anorexia, weight loss, pain. Cardiovascular: no chest pain or dyspnea on exertion Respiratory: no cough, shortness of breath, or wheezing Neurological: no TIA or stroke symptoms Dermatological: negative ENT: negative Skin: Negative. Gastrointestinal: no abdominal pain, change in bowel habits, or black or bloody stools Genito-Urinary: no dysuria, trouble voiding, or hematuria Hematological and Lymphatic: negative Breast: negative Musculoskeletal: negative Remaining ROS negative. Physical Exam: Blood pressure 112/78, pulse 104, temperature 98 F (36.7 C), temperature source Oral, resp. rate 20, height 5\' 4"  (1.626 m), weight 174 lb 11.2 oz (79.243 kg). ECOG: 0 General appearance: alert Head: Normocephalic, without obvious abnormality, atraumatic Neck: no adenopathy, no carotid bruit, no JVD, supple, symmetrical, trachea midline and thyroid not enlarged, symmetric, no tenderness/mass/nodules Lymph nodes: Cervical, supraclavicular, and axillary nodes normal. Heart:regular rate and rhythm, S1, S2 normal, no murmur, click, rub or gallop Lung:chest clear,  no wheezing, rales, normal symmetric air entry Abdomin: soft, non-tender, without masses or organomegaly EXT:no erythema, induration, or nodules   Lab Results: Lab Results  Component Value Date   WBC 1.9* 06/10/2013   HGB 12.7 06/10/2013   HCT 35.8 06/10/2013   MCV 85.2 06/10/2013   PLT 173 06/10/2013     Chemistry      Component Value  Date/Time   NA 140 06/10/2013 0851   NA 136 07/03/2012 1459   K 3.7 06/10/2013 0851   K 3.6 07/03/2012 1459   CL 101 02/06/2013 1117   CL 98 07/03/2012 1459   CO2 27 06/10/2013 0851   CO2 26 07/03/2012 1459   BUN 29.0* 06/10/2013 0851   BUN 22 07/03/2012 1459   CREATININE 1.2* 06/10/2013 0851   CREATININE 0.92 07/03/2012 1459      Component Value Date/Time   CALCIUM 9.6 06/10/2013 0851   CALCIUM 9.5 07/03/2012 1459   ALKPHOS 46 06/10/2013 0851   ALKPHOS 49 07/03/2012 1459   AST 23 06/10/2013 0851   AST 22 07/03/2012 1459   ALT 31 06/10/2013 0851   ALT 13 07/03/2012 1459   BILITOT 0.33 06/10/2013 0851   BILITOT 0.3 07/03/2012 1459       Impression and Plan: A 72 year old female with the following issues:  1. 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  She has not really had any severe bacterial infections. She has continued observation at this point and continued to refuse bone marrow biopsy at this point.  Right now I am not really seeing any deterioration in her cell lines. Ideally I would like to get a confirmation of no disease in her bone marrow at some point but she has refused as well.  In the meantime, I have continued to give her instructions to report to me any temperature of 101 or higher.  I have given her a prescription for Z-Pak in case she develops any illness.  2. Autoimmune arthritis.  Continues to be on prednisone. 3. Insomnia and anxiety: on Ativan.      [facility], MD 7/2/20149:43 AM

## 2013-07-07 ENCOUNTER — Other Ambulatory Visit: Payer: Self-pay | Admitting: *Deleted

## 2013-07-07 ENCOUNTER — Telehealth: Payer: Self-pay | Admitting: *Deleted

## 2013-07-07 MED ORDER — LORAZEPAM 1 MG PO TABS: 1.0000 mg | ORAL_TABLET | Freq: Three times a day (TID) | ORAL | Status: DC

## 2013-07-07 NOTE — Telephone Encounter (Signed)
THE LETTER WAS GIVEN TO DR.SHADAD'S NURSE, DIXIE SMITH,RN. SHE WILL TAKE CARE OF THIS ISSUE.

## 2013-07-14 ENCOUNTER — Telehealth: Payer: Self-pay | Admitting: Dietician

## 2013-07-31 ENCOUNTER — Other Ambulatory Visit: Payer: Medicare Other | Admitting: Lab

## 2013-07-31 ENCOUNTER — Ambulatory Visit: Payer: Medicare Other | Admitting: Oncology

## 2013-09-07 ENCOUNTER — Telehealth: Payer: Self-pay | Admitting: *Deleted

## 2013-09-07 NOTE — Telephone Encounter (Signed)
Jill Moss with Dr. Pollyann Savoy called for a response to be faxed to their office at [phone].  MD needs recommendation for immunosuppressant.  Patient's pain and discomfort from arthritis not addressed with prednisone.  Jill Moss notified this will be sent to Dr. Clelia Croft who is due to return to office tomorrow.

## 2013-09-15 ENCOUNTER — Other Ambulatory Visit: Payer: Self-pay | Admitting: *Deleted

## 2013-09-15 MED ORDER — LORAZEPAM 1 MG PO TABS: 1.0000 mg | ORAL_TABLET | Freq: Three times a day (TID) | ORAL | Status: DC

## 2013-09-15 NOTE — Telephone Encounter (Signed)
Pt called request for Ativan 1mg  TID. Reviewed with MD, VO for Ativan 1mg  TID Q#- 90  R-2.

## 2013-09-16 ENCOUNTER — Telehealth: Payer: Self-pay | Admitting: *Deleted

## 2013-09-16 NOTE — Telephone Encounter (Signed)
Pt called with concerns regarding rx for Ativan 1mg . Rx was sent to Pt's pharmacy on file [facility] [address]. Pt advised she needed rx to go to [facility] in [place]. Call to this pharmacy requested pt's rx be transferred. Pharmacist verbalized understanding confirmed rx would be transferred. Pt called and notified rx will be ready for pick up at [facility] in [place] per her request.

## 2013-09-23 ENCOUNTER — Telehealth: Payer: Self-pay | Admitting: *Deleted

## 2013-09-23 NOTE — Telephone Encounter (Signed)
Per collaborative nurse, Dr. Clelia Croft would like a call from the orthopedic office.  Message left on Lisa's voicemail to have Dr. Corliss Skains call to speak with Dr. Clelia Croft in reference to letter sent requesting suggestions for an immunosuppressant in light of her neutropenia.  Immunosuppressant agents lower immunity.

## 2013-12-15 ENCOUNTER — Other Ambulatory Visit: Payer: Self-pay | Admitting: *Deleted

## 2013-12-15 MED ORDER — LORAZEPAM 1 MG PO TABS: 1.0000 mg | ORAL_TABLET | Freq: Three times a day (TID) | ORAL | Status: DC

## 2014-01-14 ENCOUNTER — Other Ambulatory Visit: Payer: Self-pay | Admitting: *Deleted

## 2014-01-14 DIAGNOSIS — D702 Other drug-induced agranulocytosis: Secondary | ICD-10-CM

## 2014-01-14 MED ORDER — LORAZEPAM 1 MG PO TABS: 1.0000 mg | ORAL_TABLET | Freq: Three times a day (TID) | ORAL | Status: DC

## 2014-02-08 ENCOUNTER — Other Ambulatory Visit: Payer: Self-pay | Admitting: Oncology

## 2014-02-10 ENCOUNTER — Ambulatory Visit (INDEPENDENT_AMBULATORY_CARE_PROVIDER_SITE_OTHER): Payer: Medicare Other | Admitting: Internal Medicine

## 2014-02-10 ENCOUNTER — Encounter: Payer: Self-pay | Admitting: Internal Medicine

## 2014-02-10 VITALS — BP 124/74 | HR 88 | Temp 98.0°F | Ht 64.5 in | Wt 176.0 lb

## 2014-02-10 DIAGNOSIS — D709 Neutropenia, unspecified: Secondary | ICD-10-CM

## 2014-02-10 DIAGNOSIS — J329 Chronic sinusitis, unspecified: Secondary | ICD-10-CM

## 2014-02-10 DIAGNOSIS — R21 Rash and other nonspecific skin eruption: Secondary | ICD-10-CM

## 2014-02-10 DIAGNOSIS — J069 Acute upper respiratory infection, unspecified: Secondary | ICD-10-CM | POA: Insufficient documentation

## 2014-02-10 DIAGNOSIS — B9789 Other viral agents as the cause of diseases classified elsewhere: Principal | ICD-10-CM

## 2014-02-10 MED ORDER — AMOXICILLIN-POT CLAVULANATE 875-125 MG PO TABS: 1.0000 | ORAL_TABLET | Freq: Two times a day (BID) | ORAL | Status: DC

## 2014-02-10 NOTE — Patient Instructions (Signed)
Chest exam is good  And no signs of pneumonia. Antibiotic change for possible sinus infection . Cough med for comfort . Warm compresses saline nose spray. Expect some improvement in the next 3-4 days . follow up if not helping or as needed

## 2014-02-10 NOTE — Progress Notes (Signed)
Chief Complaint  Patient presents with  . Sore Throat    Cough is slightly productive.  Started 4-5 days ago.  Started taking Tussin DM yesterday.  . Cough  . Headache  . Pressure Behind the Eyes    HPI: Patient with sig neutropenia and autoimmuni disease  On prednisone comes in today for SDA for  new problem evaluation. Onset with sore throat  took antibiotic z pack given as needed by heme  And didn't" help at all. " Ears and face bothering her.face and teeth pain  Ear pain  Cough is bad and headaches  Taking  g with dm some. Help  Drainage is fairly clear  No sob cp  Onset a week ago  ROS: See pertinent positives and negatives per HPI.no hemoptysis sob   Generally well has arthritis in neck has scaling rash irght ankel used top antibiotic   Past Medical History  Diagnosis Date  . Depression   . Arthritis   . Neutropenia     Family History  Problem Relation Age of Onset  . Heart disease Sister   . Other Sister     Eye problems  . Other Sister     eye problems    History   Social History  . Marital Status: Married    Spouse Name: N/A    Number of Children: N/A  . Years of Education: N/A   Social History Main Topics  . Smoking status: Never Smoker   . Smokeless tobacco: None  . Alcohol Use: No  . Drug Use: No  . Sexual Activity: None   Other Topics Concern  . None   Social History Narrative   Occupation: Self-employed   Married   Regular Exercise    Outpatient Encounter Prescriptions as of 02/10/2014  Medication Sig  . Ascorbic Acid (VITAMIN C) 1000 MG tablet Take 1,000 mg by mouth daily.    Marland Kitchen azithromycin (ZITHROMAX Z-PAK) 250 MG tablet Take tow tablets on the first day. Then one daily for 4 days.  . Calcium Carbonate (CALCIUM 500 PO) Take by mouth.    . diphenhydramine-acetaminophen (TYLENOL PM) 25-500 MG TABS Patient takes 1/2 tablet at bedtime  . glucosamine-chondroitin 500-400 MG tablet Take 1 tablet by mouth 3 (three) times daily.    [employer]  CAPS Take 1 each by mouth daily.  Marland Kitchen LORazepam (ATIVAN) 1 MG tablet Take 1 tablet (1 mg total) by mouth every 8 (eight) hours.  . Multiple Vitamin (MULITIVITAMIN WITH MINERALS) TABS Take 1 tablet by mouth daily.  . predniSONE (DELTASONE) 5 MG tablet Take 7.5 mg by mouth daily. 7mg  a day  . amoxicillin-clavulanate (AUGMENTIN) 875-125 MG per tablet Take 1 tablet by mouth every 12 (twelve) hours.    EXAM:  BP 124/74  Pulse 88  Temp(Src) 98 F (36.7 C) (Oral)  Ht 5' 4.5" (1.638 m)  Wt 176 lb (79.833 kg)  BMI 29.75 kg/m2  SpO2 98%  Body mass index is 29.75 kg/(m^2). WDWN in NAD  quiet respirations; mildly congested  somewhat hoarse. Non toxic . HEENT: Normocephalic ;atraumatic , Eyes;  PERRL, EOMs  Full, lids and conjunctiva clear,,Ears: no deformities, canals nl, TM landmarks normal, Nose: no deformity or discharge but congested;face minimally tende rmaxilla  Mouth : OP clear without lesion or edema . Neck: Supple without adenopathy or masses or bruits Chest:  Clear to A without wheezes rales or rhonchi CV:  S1-S2 no gallops or murmurs peripheral perfusion is normal Skin :nl perfusion and  no acute rashes  Right foot ankle ovoid irreg patches scally near pressure points no edema or redness  PSYCH: pleasant and cooperative, no obvious depression or anxiety  ASSESSMENT AND PLAN:  Discussed the following assessment and plan:  Viral upper respiratory tract infection with cough - no evid of pna  Sinusitis - comlicating uri . high risk augmentin for now . had course of z pack per heme.   NEUTROPENIA NOS  Rash and nonspecific skin eruption - try otc hcs if not better fu for further eval stop topical antibiotic doesnt look bacterial  -Patient advised to return or notify health care team  if symptoms worsen or persist or new concerns arise.  Patient Instructions  Chest exam is good  And no signs of pneumonia. Antibiotic change for possible sinus infection . Cough med for comfort . Warm  compresses saline nose spray. Expect some improvement in the next 3-4 days . follow up if not helping or as needed      Neta Mends. Minami Arriaga M.D.  Pre visit review using our clinic review tool, if applicable. No additional management support is needed unless otherwise documented below in the visit note.

## 2014-02-11 ENCOUNTER — Telehealth: Payer: Self-pay | Admitting: Internal Medicine

## 2014-02-11 NOTE — Telephone Encounter (Signed)
Patient called in regarding a prescription issue, triager attempted to contact patient to discuss issue; line was busy. Unable to leave voicemail.

## 2014-02-12 ENCOUNTER — Other Ambulatory Visit: Payer: Self-pay | Admitting: Oncology

## 2014-02-12 NOTE — Telephone Encounter (Signed)
Spoke to the pt.  She wanted [place] to know that she did not start the antibiotics yesterday.  She slept most of the day since she does not feel well.  Has started the antibiotics today.  She will call back if any problems.

## 2014-02-15 ENCOUNTER — Telehealth: Payer: Self-pay | Admitting: Internal Medicine

## 2014-02-15 ENCOUNTER — Other Ambulatory Visit: Payer: Self-pay | Admitting: Family Medicine

## 2014-02-15 MED ORDER — DOXYCYCLINE HYCLATE 100 MG PO TABS: 100.0000 mg | ORAL_TABLET | Freq: Two times a day (BID) | ORAL | Status: DC

## 2014-02-15 NOTE — Telephone Encounter (Signed)
Patient Information:  Caller Name: Brytney  Phone: [phone]  Patient: Jill Moss, Jill Moss  Gender: Female  DOB: [birth year]  Age: 73 Years  PCP: Berniece Andreas [facility])  Office Follow Up:  Does the office need to follow up with this patient?: Yes  Instructions For The Office: Pharmacy [facility] [address]. [phone]. Request change in antibiotic.  Please contact request change in antibiotic.  Please add Amoxicillen to allergy list.  RN Note:  Pharmacy [facility] [address]. [phone]. Request change in antibiotic.  Please contact request change in antibiotic.  Please add Amoxicillen to allergy list.  Symptoms  Reason For Call & Symptoms: Patient states she was placed on antibiotics by Dr. Fabian Sharp 02/10/14.  She was placed on Amoxicillen-Clavu.  /Augmentin 875-125 .  She states she began to have side effects vomting, gastric cramping.. No rash , no itching.  She stopped the medication on Sunday.   She reports she is allergic Amoxicillen .  Reviewed Health History In EMR: Yes  Reviewed Medications In EMR: Yes  Reviewed Allergies In EMR: Yes  Reviewed Surgeries / Procedures: Yes  Date of Onset of Symptoms: 02/10/2014  Guideline(s) Used:  No Protocol Available - Information Only  Disposition Per Guideline:   Discuss with PCP and Callback by Nurse Today  Reason For Disposition Reached:   Nursing judgment  Advice Given:  Call Back If:  New symptoms develop  You become worse.  RN Overrode Recommendation:  Patient Requests Prescription  Pharmacy [facility] [address]. [phone]. Request change in antibiotic.  Please contact request change in antibiotic.  Please add Amoxicillen to allergy list.

## 2014-02-15 NOTE — Telephone Encounter (Signed)
Pt.notified

## 2014-02-15 NOTE — Telephone Encounter (Signed)
Pt call back once abx has been change and sent to pharm

## 2014-02-15 NOTE — Telephone Encounter (Signed)
Sent doxycycline.

## 2014-02-15 NOTE — Telephone Encounter (Signed)
Can you change antibiotic?  Will add Augment to her allergy list

## 2014-03-04 ENCOUNTER — Ambulatory Visit (HOSPITAL_BASED_OUTPATIENT_CLINIC_OR_DEPARTMENT_OTHER): Payer: Medicare Other | Admitting: Oncology

## 2014-03-04 ENCOUNTER — Telehealth: Payer: Self-pay | Admitting: Oncology

## 2014-03-04 ENCOUNTER — Other Ambulatory Visit (HOSPITAL_BASED_OUTPATIENT_CLINIC_OR_DEPARTMENT_OTHER): Payer: Medicare Other

## 2014-03-04 ENCOUNTER — Encounter: Payer: Self-pay | Admitting: Oncology

## 2014-03-04 VITALS — BP 132/70 | HR 99 | Temp 97.1°F | Resp 18 | Ht 64.0 in | Wt 175.1 lb

## 2014-03-04 DIAGNOSIS — D708 Other neutropenia: Secondary | ICD-10-CM

## 2014-03-04 DIAGNOSIS — D709 Neutropenia, unspecified: Secondary | ICD-10-CM

## 2014-03-04 DIAGNOSIS — F411 Generalized anxiety disorder: Secondary | ICD-10-CM

## 2014-03-04 DIAGNOSIS — G4700 Insomnia, unspecified: Secondary | ICD-10-CM

## 2014-03-04 LAB — COMPREHENSIVE METABOLIC PANEL (CC13)
ALBUMIN: 3.5 g/dL (ref 3.5–5.0)
ALK PHOS: 51 U/L (ref 40–150)
ALT: 13 U/L (ref 0–55)
ANION GAP: 12 meq/L — AB (ref 3–11)
AST: 18 U/L (ref 5–34)
BUN: 20.5 mg/dL (ref 7.0–26.0)
CO2: 25 meq/L (ref 22–29)
Calcium: 9.9 mg/dL (ref 8.4–10.4)
Chloride: 103 mEq/L (ref 98–109)
Creatinine: 1.1 mg/dL (ref 0.6–1.1)
GLUCOSE: 132 mg/dL (ref 70–140)
POTASSIUM: 3.8 meq/L (ref 3.5–5.1)
SODIUM: 140 meq/L (ref 136–145)
TOTAL PROTEIN: 7.6 g/dL (ref 6.4–8.3)
Total Bilirubin: 0.39 mg/dL (ref 0.20–1.20)

## 2014-03-04 LAB — CBC WITH DIFFERENTIAL/PLATELET
BASO%: 0.8 % (ref 0.0–2.0)
BASOS ABS: 0 10*3/uL (ref 0.0–0.1)
EOS ABS: 0 10*3/uL (ref 0.0–0.5)
EOS%: 3.3 % (ref 0.0–7.0)
HCT: 37.4 % (ref 34.8–46.6)
HEMOGLOBIN: 12.5 g/dL (ref 11.6–15.9)
LYMPH#: 0.7 10*3/uL — AB (ref 0.9–3.3)
LYMPH%: 53.3 % — ABNORMAL HIGH (ref 14.0–49.7)
MCH: 28.6 pg (ref 25.1–34.0)
MCHC: 33.4 g/dL (ref 31.5–36.0)
MCV: 85.6 fL (ref 79.5–101.0)
MONO#: 0.5 10*3/uL (ref 0.1–0.9)
MONO%: 42.6 % — ABNORMAL HIGH (ref 0.0–14.0)
NEUT%: 0 % — ABNORMAL LOW (ref 38.4–76.8)
NEUTROS ABS: 0 10*3/uL — AB (ref 1.5–6.5)
Platelets: 151 10*3/uL (ref 145–400)
RBC: 4.37 10*6/uL (ref 3.70–5.45)
RDW: 13.5 % (ref 11.2–14.5)
WBC: 1.2 10*3/uL — ABNORMAL LOW (ref 3.9–10.3)
nRBC: 0 % (ref 0–0)

## 2014-03-04 MED ORDER — LORAZEPAM 1 MG PO TABS: 1.0000 mg | ORAL_TABLET | Freq: Three times a day (TID) | ORAL | Status: DC | PRN

## 2014-03-04 MED ORDER — AZITHROMYCIN 250 MG PO TABS: ORAL_TABLET | ORAL | Status: DC

## 2014-03-04 NOTE — Telephone Encounter (Signed)
gv adn printd aptp sched and avs for pt for July...Marland Kitchenper MD ok to s ched at 

## 2014-03-04 NOTE — Progress Notes (Signed)
Hematology and Oncology Follow Up Visit  Jill Moss [id number] 10-12-1941 73 y.o. 03/04/2014 12:22 PM    CC: Mariann Laster K. 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  Interim History:  Jill Moss presents today for a followup visit.  This is a very pleasant 73 year old with chronic neutropenia that really again dates back for at least the last 10 years and has been monitored now at least here for the last 8 years without any major complications associated with that.  She has not really had any recurrent sinopulmonary infections, did not have any pneumonias or bacterial infections.  However, she did have a viral infection with sore throat as well as cough which he has taken antibiotics for it.  Overall Jill Moss is doing well nowl and has not really had any other complaints. She is reporting titers have been about the same with the current prednisone dose.  Medications: I have reviewed the patient's current medications.  Current Outpatient Prescriptions  Medication Sig Dispense Refill  . Ascorbic Acid (VITAMIN C) 1000 MG tablet Take 1,000 mg by mouth daily.        Marland Kitchen azithromycin (ZITHROMAX Z-PAK) 250 MG tablet Take tow tablets on the first day. Then one daily for 4 days.  6 each  1  . Calcium Carbonate (CALCIUM 500 PO) Take by mouth.        . diphenhydramine-acetaminophen (TYLENOL PM) 25-500 MG TABS Patient takes 1/2 tablet at bedtime      . doxycycline (VIBRA-TABS) 100 MG tablet Take 1 tablet (100 mg total) by mouth 2 (two) times daily.  20 tablet  0  . glucosamine-chondroitin 500-400 MG tablet Take 1 tablet by mouth 3 (three) times daily.        Marland Kitchen LORazepam  (ATIVAN) 1 MG tablet Take 1 tablet (1 mg total) by mouth every 8 (eight) hours as needed for anxiety.  90 tablet  0  . Multiple Vitamin (MULITIVITAMIN WITH MINERALS) TABS Take 1 tablet by mouth daily.      . predniSONE (DELTASONE) 5 MG tablet Take 7.5 mg by mouth daily. 83m a day      . Krill Oil CAPS Take 1 each by mouth daily.       No current facility-administered medications for this visit.    Allergies:  Allergies  Allergen Reactions  . Augmentin [Amoxicillin-Pot Clavulanate] Nausea And Vomiting    Stomach cramping    Past Medical History, Surgical history, Social history, and Family History were reviewed and updated.  Review of Systems: Constitutional:  Negative for fever, chills, night sweats, anorexia, weight loss, pain. Cardiovascular: no chest pain or dyspnea on exertion Respiratory: no cough, shortness of breath, or wheezing Neurological: no TIA or stroke symptoms Dermatological: negative ENT: negative Skin: Negative. Gastrointestinal: no abdominal pain, change in bowel habits, or black or bloody stools Genito-Urinary: no dysuria, trouble voiding, or hematuria Hematological and Lymphatic: negative Breast: negative Musculoskeletal: negative Remaining ROS negative. Physical Exam: Blood pressure 132/70, pulse 99, temperature 97.1 F (36.2 C), temperature source Oral, resp. rate 18, height 5' 4"  (1.626 m), weight 175 lb 1.6 oz (79.425 kg), SpO2 100.00%. ECOG: 0 General appearance: alert Head: Normocephalic, without obvious abnormality, atraumatic  Neck: no adenopathy, no carotid bruit, no JVD, supple, symmetrical, trachea midline and thyroid not enlarged, symmetric, no tenderness/mass/nodules Lymph nodes: Cervical, supraclavicular, and axillary nodes normal. Heart:regular rate and rhythm, S1, S2 normal, no murmur, click, rub or gallop Lung:chest clear, no wheezing, rales, normal symmetric air entry Abdomin: soft, non-tender, without masses or organomegaly EXT:no  erythema, induration, or nodules   Lab Results: Lab Results  Component Value Date   WBC 1.2* 03/04/2014   HGB 12.5 03/04/2014   HCT 37.4 03/04/2014   MCV 85.6 03/04/2014   PLT 151 03/04/2014     Chemistry      Component Value Date/Time   NA 140 06/10/2013 0851   NA 136 07/03/2012 1459   K 3.7 06/10/2013 0851   K 3.6 07/03/2012 1459   CL 101 02/06/2013 1117   CL 98 07/03/2012 1459   CO2 27 06/10/2013 0851   CO2 26 07/03/2012 1459   BUN 29.0* 06/10/2013 0851   BUN 22 07/03/2012 1459   CREATININE 1.2* 06/10/2013 0851   CREATININE 0.92 07/03/2012 1459      Component Value Date/Time   CALCIUM 9.6 06/10/2013 0851   CALCIUM 9.5 07/03/2012 1459   ALKPHOS 46 06/10/2013 0851   ALKPHOS 49 07/03/2012 1459   AST 23 06/10/2013 0851   AST 22 07/03/2012 1459   ALT 31 06/10/2013 0851   ALT 13 07/03/2012 1459   BILITOT 0.33 06/10/2013 0851   BILITOT 0.3 07/03/2012 1459       Impression and Plan: A 73 year old female with the following issues:  1. 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  She has not really had any severe bacterial infections. She has continued observation at this point and continued to refuse bone marrow biopsy at this point.  Right now I am not really seeing any deterioration in her cell lines. Ideally I would like to get a confirmation of no disease in her bone marrow at some point but she has refused as well.  In the meantime, I have continued to give her instructions to report to me any temperature of 101 or higher.  I have given her a prescription for Z-Pak in case she develops any illness.  2. Autoimmune arthritis.  Continues to be on prednisone. 3. Insomnia and anxiety: on Ativan.      [facility], MD 3/26/201512:22 PM

## 2014-03-08 ENCOUNTER — Telehealth: Payer: Self-pay | Admitting: Oncology

## 2014-03-08 NOTE — Telephone Encounter (Signed)
s.w. pt r.s appt to pt request....done...pt aware of new d.t.Marland KitchenMarland KitchenMarland Kitchenreassured pt that 7.24.15 appt has been cx.

## 2014-04-07 ENCOUNTER — Telehealth: Payer: Self-pay | Admitting: Medical Oncology

## 2014-04-07 ENCOUNTER — Other Ambulatory Visit: Payer: Self-pay | Admitting: Oncology

## 2014-04-07 NOTE — Telephone Encounter (Signed)
LVMOM regarding prescription refill for Ativan phoned to patient's pharmacy. Patient to return call should she have any questions or concerns.

## 2014-05-06 ENCOUNTER — Other Ambulatory Visit: Payer: Self-pay | Admitting: Oncology

## 2014-05-08 ENCOUNTER — Other Ambulatory Visit: Payer: Self-pay | Admitting: Oncology

## 2014-05-10 ENCOUNTER — Other Ambulatory Visit: Payer: Self-pay | Admitting: Medical Oncology

## 2014-05-10 ENCOUNTER — Telehealth: Payer: Self-pay | Admitting: Medical Oncology

## 2014-05-10 MED ORDER — LORAZEPAM 1 MG PO TABS: ORAL_TABLET | ORAL | Status: DC

## 2014-05-10 NOTE — Telephone Encounter (Signed)
Patient informed prescription refill for Ativan phoned to her listed pharmacy.

## 2014-05-10 NOTE — Telephone Encounter (Signed)
MD out of office today. Reviewed with Jerelyn Charles, NP ok to refill Ativan.

## 2014-05-10 NOTE — Telephone Encounter (Signed)
Pt LVMOM requesting prescription refill of Ativan. Will review with MD.   Theron Arista 03/04/2014 07/06/2014 Lab/MD

## 2014-05-12 ENCOUNTER — Encounter (HOSPITAL_COMMUNITY): Payer: Self-pay | Admitting: Emergency Medicine

## 2014-05-12 ENCOUNTER — Encounter: Payer: Self-pay | Admitting: Internal Medicine

## 2014-05-12 ENCOUNTER — Inpatient Hospital Stay (HOSPITAL_COMMUNITY)
Admission: EM | Admit: 2014-05-12 | Discharge: 2014-05-15 | DRG: 872 | Disposition: A | Discharge status: Home / Self Care | Payer: Medicare Other | Attending: Internal Medicine | Admitting: Internal Medicine

## 2014-05-12 ENCOUNTER — Emergency Department (HOSPITAL_COMMUNITY): Payer: Medicare Other

## 2014-05-12 ENCOUNTER — Ambulatory Visit (INDEPENDENT_AMBULATORY_CARE_PROVIDER_SITE_OTHER): Payer: Medicare Other | Admitting: Internal Medicine

## 2014-05-12 VITALS — BP 104/64 | HR 115 | Temp 102.3°F | Ht 64.5 in | Wt 175.0 lb

## 2014-05-12 DIAGNOSIS — F3289 Other specified depressive episodes: Secondary | ICD-10-CM | POA: Diagnosis present

## 2014-05-12 DIAGNOSIS — M069 Rheumatoid arthritis, unspecified: Secondary | ICD-10-CM | POA: Diagnosis present

## 2014-05-12 DIAGNOSIS — M25561 Pain in right knee: Secondary | ICD-10-CM | POA: Diagnosis present

## 2014-05-12 DIAGNOSIS — Z79899 Other long term (current) drug therapy: Secondary | ICD-10-CM

## 2014-05-12 DIAGNOSIS — R7401 Elevation of levels of liver transaminase levels: Secondary | ICD-10-CM

## 2014-05-12 DIAGNOSIS — E871 Hypo-osmolality and hyponatremia: Secondary | ICD-10-CM | POA: Diagnosis present

## 2014-05-12 DIAGNOSIS — IMO0002 Reserved for concepts with insufficient information to code with codable children: Secondary | ICD-10-CM

## 2014-05-12 DIAGNOSIS — W57XXXA Bitten or stung by nonvenomous insect and other nonvenomous arthropods, initial encounter: Secondary | ICD-10-CM | POA: Diagnosis present

## 2014-05-12 DIAGNOSIS — R509 Fever, unspecified: Secondary | ICD-10-CM

## 2014-05-12 DIAGNOSIS — R000 Tachycardia, unspecified: Secondary | ICD-10-CM

## 2014-05-12 DIAGNOSIS — M1710 Unilateral primary osteoarthritis, unspecified knee: Secondary | ICD-10-CM | POA: Diagnosis present

## 2014-05-12 DIAGNOSIS — R5081 Fever presenting with conditions classified elsewhere: Secondary | ICD-10-CM | POA: Diagnosis present

## 2014-05-12 DIAGNOSIS — D709 Neutropenia, unspecified: Secondary | ICD-10-CM

## 2014-05-12 DIAGNOSIS — F411 Generalized anxiety disorder: Secondary | ICD-10-CM | POA: Diagnosis present

## 2014-05-12 DIAGNOSIS — I498 Other specified cardiac arrhythmias: Secondary | ICD-10-CM | POA: Diagnosis present

## 2014-05-12 DIAGNOSIS — A419 Sepsis, unspecified organism: Principal | ICD-10-CM | POA: Diagnosis present

## 2014-05-12 DIAGNOSIS — M25562 Pain in left knee: Secondary | ICD-10-CM | POA: Diagnosis present

## 2014-05-12 DIAGNOSIS — M1711 Unilateral primary osteoarthritis, right knee: Secondary | ICD-10-CM

## 2014-05-12 DIAGNOSIS — D61818 Other pancytopenia: Secondary | ICD-10-CM | POA: Diagnosis present

## 2014-05-12 DIAGNOSIS — E876 Hypokalemia: Secondary | ICD-10-CM | POA: Diagnosis not present

## 2014-05-12 DIAGNOSIS — S90569A Insect bite (nonvenomous), unspecified ankle, initial encounter: Secondary | ICD-10-CM | POA: Diagnosis present

## 2014-05-12 DIAGNOSIS — M1712 Unilateral primary osteoarthritis, left knee: Secondary | ICD-10-CM

## 2014-05-12 DIAGNOSIS — Z7952 Long term (current) use of systemic steroids: Secondary | ICD-10-CM | POA: Insufficient documentation

## 2014-05-12 DIAGNOSIS — F329 Major depressive disorder, single episode, unspecified: Secondary | ICD-10-CM

## 2014-05-12 DIAGNOSIS — R740 Nonspecific elevation of levels of transaminase and lactic acid dehydrogenase [LDH]: Secondary | ICD-10-CM

## 2014-05-12 DIAGNOSIS — J069 Acute upper respiratory infection, unspecified: Secondary | ICD-10-CM

## 2014-05-12 DIAGNOSIS — B9789 Other viral agents as the cause of diseases classified elsewhere: Secondary | ICD-10-CM

## 2014-05-12 DIAGNOSIS — S60469A Insect bite (nonvenomous) of unspecified finger, initial encounter: Secondary | ICD-10-CM | POA: Diagnosis present

## 2014-05-12 DIAGNOSIS — D708 Other neutropenia: Secondary | ICD-10-CM | POA: Diagnosis present

## 2014-05-12 LAB — URINALYSIS, ROUTINE W REFLEX MICROSCOPIC
BILIRUBIN URINE: NEGATIVE
GLUCOSE, UA: NEGATIVE mg/dL
KETONES UR: 15 mg/dL — AB
Nitrite: NEGATIVE
PROTEIN: 30 mg/dL — AB
Specific Gravity, Urine: 1.025 (ref 1.005–1.030)
Urobilinogen, UA: 1 mg/dL (ref 0.0–1.0)
pH: 5 (ref 5.0–8.0)

## 2014-05-12 LAB — COMPREHENSIVE METABOLIC PANEL
ALK PHOS: 58 U/L (ref 39–117)
ALK PHOS: 56 U/L (ref 39–117)
ALT: 67 U/L — AB (ref 0–35)
ALT: 65 U/L — ABNORMAL HIGH (ref 0–35)
AST: 75 U/L — ABNORMAL HIGH (ref 0–37)
AST: 74 U/L — AB (ref 0–37)
Albumin: 3.1 g/dL — ABNORMAL LOW (ref 3.5–5.2)
Albumin: 3 g/dL — ABNORMAL LOW (ref 3.5–5.2)
BILIRUBIN TOTAL: 0.3 mg/dL (ref 0.3–1.2)
BUN: 18 mg/dL (ref 6–23)
BUN: 17 mg/dL (ref 6–23)
CHLORIDE: 94 meq/L — AB (ref 96–112)
CO2: 23 meq/L (ref 19–32)
CO2: 22 meq/L (ref 19–32)
CREATININE: 1.08 mg/dL (ref 0.50–1.10)
Calcium: 8.6 mg/dL (ref 8.4–10.5)
Calcium: 8.6 mg/dL (ref 8.4–10.5)
Chloride: 95 mEq/L — ABNORMAL LOW (ref 96–112)
Creatinine, Ser: 1.12 mg/dL — ABNORMAL HIGH (ref 0.50–1.10)
GFR calc Af Amer: 56 mL/min — ABNORMAL LOW (ref >90)
GFR calc Af Amer: 59 mL/min — ABNORMAL LOW (ref >90)
GFR calc non Af Amer: 49 mL/min — ABNORMAL LOW (ref >90)
GFR, EST NON AFRICAN AMERICAN: 51 mL/min — AB (ref >90)
Glucose, Bld: 126 mg/dL — ABNORMAL HIGH (ref 70–99)
Glucose, Bld: 123 mg/dL — ABNORMAL HIGH (ref 70–99)
POTASSIUM: 4.3 meq/L (ref 3.7–5.3)
Potassium: 4 mEq/L (ref 3.7–5.3)
SODIUM: 130 meq/L — AB (ref 137–147)
Sodium: 128 mEq/L — ABNORMAL LOW (ref 137–147)
TOTAL PROTEIN: 7.3 g/dL (ref 6.0–8.3)
Total Bilirubin: 0.4 mg/dL (ref 0.3–1.2)
Total Protein: 7.2 g/dL (ref 6.0–8.3)

## 2014-05-12 LAB — DIFFERENTIAL
BASOS ABS: 0 10*3/uL (ref 0.0–0.1)
Basophils Relative: 1 % (ref 0–1)
Eosinophils Absolute: 0 10*3/uL (ref 0.0–0.7)
Eosinophils Relative: 1 % (ref 0–5)
LYMPHS PCT: 35 % (ref 12–46)
Lymphs Abs: 0.3 10*3/uL — ABNORMAL LOW (ref 0.7–4.0)
MONO ABS: 0.2 10*3/uL (ref 0.1–1.0)
Monocytes Relative: 24 % — ABNORMAL HIGH (ref 3–12)
NEUTROS PCT: 39 % — AB (ref 43–77)
Neutro Abs: 0.4 10*3/uL — ABNORMAL LOW (ref 1.7–7.7)

## 2014-05-12 LAB — URINE MICROSCOPIC-ADD ON

## 2014-05-12 LAB — CBC
HCT: 34.8 % — ABNORMAL LOW (ref 36.0–46.0)
Hemoglobin: 11.5 g/dL — ABNORMAL LOW (ref 12.0–15.0)
MCH: 28.4 pg (ref 26.0–34.0)
MCHC: 33 g/dL (ref 30.0–36.0)
MCV: 85.9 fL (ref 78.0–100.0)
Platelets: 88 10*3/uL — ABNORMAL LOW (ref 150–400)
RBC: 4.05 MIL/uL (ref 3.87–5.11)
RDW: 13.9 % (ref 11.5–15.5)
WBC: 0.9 10*3/uL — CL (ref 4.0–10.5)

## 2014-05-12 LAB — STREP PNEUMONIAE URINARY ANTIGEN: STREP PNEUMO URINARY ANTIGEN: NEGATIVE

## 2014-05-12 LAB — I-STAT CG4 LACTIC ACID, ED: Lactic Acid, Venous: 0.91 mmol/L (ref 0.5–2.2)

## 2014-05-12 MED ORDER — LORAZEPAM 1 MG PO TABS
1.0000 mg | ORAL_TABLET | Freq: Four times a day (QID) | ORAL | Status: DC | PRN
  Administered 2014-05-13 – 2014-05-15 (×3): 1 mg via ORAL
  Filled 2014-05-12 (×4): qty 1

## 2014-05-12 MED ORDER — SODIUM CHLORIDE 0.9 % IV SOLN
1000.0000 mL | Freq: Once | INTRAVENOUS | Status: AC
  Administered 2014-05-12: 1000 mL via INTRAVENOUS

## 2014-05-12 MED ORDER — SODIUM CHLORIDE 0.9 % IV BOLUS (SEPSIS)
1000.0000 mL | Freq: Once | INTRAVENOUS | Status: AC
  Administered 2014-05-12: 1000 mL via INTRAVENOUS

## 2014-05-12 MED ORDER — ACETAMINOPHEN 325 MG PO TABS
650.0000 mg | ORAL_TABLET | Freq: Once | ORAL | Status: AC
  Administered 2014-05-12: 650 mg via ORAL
  Filled 2014-05-12: qty 2

## 2014-05-12 MED ORDER — DOXYCYCLINE HYCLATE 50 MG PO CAPS
50.0000 mg | ORAL_CAPSULE | Freq: Two times a day (BID) | ORAL | Status: DC
  Administered 2014-05-13: 50 mg via ORAL
  Filled 2014-05-12 (×3): qty 1

## 2014-05-12 MED ORDER — SODIUM CHLORIDE 0.9 % IJ SOLN
3.0000 mL | Freq: Two times a day (BID) | INTRAMUSCULAR | Status: DC
  Administered 2014-05-12 – 2014-05-14 (×3): 3 mL via INTRAVENOUS

## 2014-05-12 MED ORDER — VANCOMYCIN HCL IN DEXTROSE 1-5 GM/200ML-% IV SOLN
1000.0000 mg | Freq: Once | INTRAVENOUS | Status: AC
  Administered 2014-05-12: 1000 mg via INTRAVENOUS
  Filled 2014-05-12: qty 200

## 2014-05-12 MED ORDER — ENOXAPARIN SODIUM 40 MG/0.4ML ~~LOC~~ SOLN
40.0000 mg | Freq: Every day | SUBCUTANEOUS | Status: DC
  Administered 2014-05-13 – 2014-05-14 (×3): 40 mg via SUBCUTANEOUS
  Filled 2014-05-12 (×5): qty 0.4

## 2014-05-12 MED ORDER — ACETAMINOPHEN 325 MG PO TABS
650.0000 mg | ORAL_TABLET | Freq: Four times a day (QID) | ORAL | Status: DC | PRN
  Administered 2014-05-12 – 2014-05-14 (×3): 650 mg via ORAL
  Filled 2014-05-12 (×3): qty 2

## 2014-05-12 MED ORDER — HYDROCORTISONE NA SUCCINATE PF 100 MG IJ SOLR
100.0000 mg | Freq: Three times a day (TID) | INTRAMUSCULAR | Status: DC
  Administered 2014-05-13 – 2014-05-15 (×8): 100 mg via INTRAVENOUS
  Filled 2014-05-12 (×13): qty 2

## 2014-05-12 MED ORDER — ACETAMINOPHEN 650 MG RE SUPP: 650.0000 mg | Freq: Four times a day (QID) | RECTAL | Status: DC | PRN

## 2014-05-12 MED ORDER — PIPERACILLIN-TAZOBACTAM 3.375 G IVPB
3.3750 g | Freq: Three times a day (TID) | INTRAVENOUS | Status: DC
  Administered 2014-05-12 – 2014-05-15 (×8): 3.375 g via INTRAVENOUS
  Filled 2014-05-12 (×11): qty 50

## 2014-05-12 MED ORDER — VANCOMYCIN HCL IN DEXTROSE 750-5 MG/150ML-% IV SOLN
750.0000 mg | Freq: Two times a day (BID) | INTRAVENOUS | Status: DC
  Administered 2014-05-13 – 2014-05-15 (×5): 750 mg via INTRAVENOUS
  Filled 2014-05-12 (×7): qty 150

## 2014-05-12 NOTE — Patient Instructions (Signed)
Advise  ED now because of fever chills neutropenia on prednisone for autoimmune.  condition and no obv source of fever to treat.

## 2014-05-12 NOTE — H&P (Addendum)
[facility] History and Physical  Jill Moss ONG:[id number] DOB: [birth year] DOA: 05/12/2014  Referring physician: Dr. Corlis Leak PCP: Jill Dawson, MD   Chief Complaint: Fever, chills  HPI: Jill Moss is a 73 y.o. female. 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 Reportedly this has been stable for decades. The patient has not undergone bone marrow biopsy. To manage this, the patient takes a course of azithromycin prior to travel or when she feels a respiratory infection developing. She also has chronic rheumatoid arthritis which is managed on prednisone 7.5 mg daily. She was on methotrexate for long time ago but was concerned about side effects and discontinued this medication.  The patient's current symptoms started 2 days ago when she developed fever and chills. It was Monday night i.e. 2 nights prior to admission when the patient awoke with fevers and chills. She took Tylenol alternating with ibuprofen which helped her symptoms considerably. Her fever returned the patient was evaluated by her PCP on the day of admission, who recommended evaluation in the emergency department. The patient really denies any other symptoms. She frankly denies dyspnea, cough. She may have some fatigue that she endorses. The patient denies any dysuria or other urinary changes. She denies diarrhea. She denies sick contacts, stating that she is highly cautious when dealing with individuals who may have infectious illnesses. The patient does relate an interesting history of multiple arthropod bites, one perhaps a spider bite to her right fourth digit, dorsal aspect, as well as a tick bite to her left posterior thigh. She describes that there is a scaled lesion that remains on her right fourth digit but there was no rash or other lesion that followed a tick bite.  Patient was started on empiric treatment for sepsis in the setting of neutropenic  fever in the emergency department and admitted for further management.  Review of Systems:  Review of Systems - History obtained from the patient General ROS: positive for  - chills, fatigue and fever negative for - malaise or weight loss ENT ROS: negative Respiratory ROS: no cough, shortness of breath, or wheezing Cardiovascular ROS: no chest pain or dyspnea on exertion Gastrointestinal ROS: no abdominal pain, change in bowel habits, or black or bloody stools Genito-Urinary ROS: no dysuria, trouble voiding, or hematuria Musculoskeletal ROS: negative Neurological ROS: no TIA or stroke symptoms Dermatological ROS: positive for arthropod bites negative for rash and skin lesion changes The remainder of a complete review of systems was reviewed and was negative.  Past Medical History  Diagnosis Date  . Depression   . Arthritis   . Neutropenia    Past Surgical History  Procedure Laterality Date  . Breast surgery      Breast Biopsy, lumpectomy  . Tonsillectomy     Social History:  reports that she has never smoked. She does not have any smokeless tobacco history on file. She reports that she does not drink alcohol or use illicit drugs.  Allergies  Allergen Reactions  . Augmentin [Amoxicillin-Pot Clavulanate] Nausea And Vomiting    Stomach cramping    Family History  Problem Relation Age of Onset  . Heart disease Sister   . Other Sister     Eye problems  . Other Sister     eye problems     Prior to Admission medications   Medication Sig Start Date End Date Taking? Authorizing Provider  Ascorbic Acid (VITAMIN C) 1000 MG tablet Take 1,000 mg by mouth daily.  Yes Historical Provider, MD  Calcium Carbonate (CALCIUM 500 PO) Take by mouth.     Yes Historical Provider, MD  diphenhydramine-acetaminophen (TYLENOL PM) 25-500 MG TABS Patient takes 1/2 tablet at bedtime   Yes Historical Provider, MD  glucosamine-chondroitin 500-400 MG tablet Take 1 tablet by mouth 3 (three) times  daily.     Yes Historical Provider, MD  Astrid Drafts CAPS Take 1 each by mouth daily.   Yes Historical Provider, MD  LORazepam (ATIVAN) 1 MG tablet TAKE ONE TABLET BY MOUTH THREE TIMES DAILY AS NEEDED FOR ANXIETY 05/10/14  Yes Maryanna Shape, NP  Multiple Vitamin (MULITIVITAMIN WITH MINERALS) TABS Take 1 tablet by mouth daily.   Yes Historical Provider, MD  predniSONE (DELTASONE) 5 MG tablet Take 7.5 mg by mouth daily. 34m a day   Yes Historical Provider, MD    Physical Exam: Filed Vitals:   05/12/14 2130  BP: 93/51  Pulse: 94  Temp:   Resp: 25   BP 93/51  Pulse 94  Temp(Src) 98.7 F (37.1 C) (Oral)  Resp 25  Ht 5' 4"  (1.626 m)  Wt 77.565 kg (171 lb)  BMI 29.34 kg/m2  SpO2 98%  General: Alert, oriented to self, place, and time. HEENT: Gaze is conjugate.  No nasal deformities.  No nasal discharge noted.  Mucus membranes are moist. hydrated.  Tongue protrudes midline. Cardiovascular: Slightly tachycardic low 100s rate with regular rhythm.  Normal S1 and S2.  No rubs or gallop sounds.  No murmur.  No jugular venous distention noted.  Pulmonary: Good aeration to the bilateral bases.  Left base is with early inspiratory wet crackles and soft rhonchi. Abdominal: Soft, without distention.  Non-tender throughout.  Bowel sounds are normoactive.   GU: Rectal exam deferred. MSK: Extremities without cyanosis and with good capillary refill.  Trace edema bilaterally. Skin: Without icterus No significant wounds.  Right 4th digit is with a central scabbed lesion and mild surrounding erythema.  Left posterior thigh (site of presumed tick bite) is with normal overlying skin, no rash there. Neuropsychiatric: Good attention.  No obvious motor deficits.  Good insight into situation and disease process.  Capable of making medical decisions.    Labs on Admission:  Basic Metabolic Panel:  Recent Labs Lab 05/12/14 1729 05/12/14 1813  NA 130* 128*  K 4.3 4.0  CL 95* 94*  CO2 23 22  GLUCOSE 126* 123*   BUN 18 17  CREATININE 1.12* 1.08  CALCIUM 8.6 8.6   Liver Function Tests:  Recent Labs Lab 05/12/14 1729 05/12/14 1813  AST 75* 74*  ALT 67* 65*  ALKPHOS 58 56  BILITOT 0.4 0.3  PROT 7.3 7.2  ALBUMIN 3.1* 3.0*   No results found for this basename: LIPASE, AMYLASE,  in the last 168 hours No results found for this basename: AMMONIA,  in the last 168 hours CBC:  Recent Labs Lab 05/12/14 1729  WBC 0.9*  NEUTROABS 0.4*  HGB 11.5*  HCT 34.8*  MCV 85.9  PLT 88*   Cardiac Enzymes: No results found for this basename: CKTOTAL, CKMB, CKMBINDEX, TROPONINI,  in the last 168 hours  BNP (last 3 results) No results found for this basename: PROBNP,  in the last 8760 hours CBG: No results found for this basename: GLUCAP,  in the last 168 hours  Radiological Exams on Admission: Dg Chest Port 1 View  05/12/2014   CLINICAL DATA:  Fever for 2 days.  Low white cell count.  EXAM: PORTABLE CHEST - 1  VIEW  COMPARISON:  None.  FINDINGS: 1838 hrs. AP semi upright portable view of the chest shows diffuse haziness over the left lower lung with preservation of the cardiac and hemidiaphragmatic margins. Right lung is clear. Heart size is normal. Telemetry leads overlie the chest.  IMPRESSION: Haziness over the lower left chest is probably related to positioning although a posterior pleural effusion cannot be completely excluded. Dedicated repeat PA chest x-ray evaluation with lateral is recommended to reassess.   Electronically Signed   By: Misty Stanley M.D.   On: 05/12/2014 18:47    EKG: Independently reviewed. Sinus tach  Chest x-ray independently reviewed: Left basilar opacifications possibly magnified by positioning but in the setting of the patient's crackles on respiratory exam could represent a pneumonia  Assessment/Plan Active Problems:   Fever   Sepsis  (hospital diagnoses bolded)  1. Presumed developing sepsis in the setting of neutropenic fever. ANC count 400. The patient has very  little cough or dyspnea but the patient's left-sided crackles and x-ray opacifications could indicate a developing pneumonia. She also has some posterior pharyngeal erythema; the patient may have viral infection causing this presentation but certainly considering her pancytopenia, broad spectrum antibiotics are indicated.  Also has recent tick bite, which raises suspicion for tickborne pathogens perhaps presenting atypically in this immunosuppressed patient. Patient is status post 2 L IV fluids in the ED.  Continue vancomycin, Zosyn (low severity Augmentin allergy; tolerated Zosyn in ED) per pharmacy consultation to cover empirically for typical bacterial pathogens implicated in neutropenic fever as well as a possible mild cellulitis/skin soft tissue structure infection of the right fourth digit, which granted is unlikely the source of infection  Obtain RVP  Obtain streptococcus pneumoniae, Legionella urinary antigens  Initiate doxycycline to cover for tick borne illness  Obtain Lyme disease, R MSF serologies  Followup blood cultures, urine cultures   Pending results of above, patient may benefit from infectious diseases consultation and/or chest CT 2. Chronic pancytopenia and neutropenia; immunosuppressed status.  Pancytopenia is of unclear etiology but may be autoimmune in nature; possible aplastic anemia. WBC ranges 1.2-2.5 per chart review.  Patient has not been on methotrexate for many years, so this is an unlikely explanation.  Per the patient report and notes from PCP and hematology, the patient has had surprisingly little systemic illness over the course of the time she has been identified with the cytopenias. Continue monitoring blood counts.  3. Chronic use of steroids and possible adrenal suppression; presumably rheumatoid (autoimmune) arthritis. Patient takes prednisone 7.5 mg daily at baseline. RA was diagnosed reportedly in 1999 with knees and feet most affected. Patient does not  describe active inflammatory arthritis symptoms, nor do I appreciate evidence of this on exam.  Considering probable developing sepsis with borderline hypertension, dose with stress dose hydrocortisone during period of acute illness 4. Hyponatremia monitor BMPs serially   Code Status: Full  Family Communication:  discussed with the patient's husband at the bedside, questions were answered   Disposition Plan: inpatient step down considering hypotension    Time spent: 58 minutes  [facility] Pager [phone]

## 2014-05-12 NOTE — ED Notes (Signed)
Resident Ronald Lobo notified

## 2014-05-12 NOTE — ED Notes (Signed)
reports that she just feels quesy no n/v/d

## 2014-05-12 NOTE — Progress Notes (Signed)
Chief Complaint  Patient presents with  . Fever    Has taken 1000 mg of Tylenol at 8 am.  . Chills  . Generalized Body Aches  . Fatigue    HPI: Patient comes in today for SDA for  new problem evaluation. With husband .   Onset with fever and shivering 2 days ago  And  Went down in day  And took tylenol every 4 hours and dec day yesterday  And then back . Last pm and today feels bad  Non focal No pain  Minor cough  No sob no vomiting diarrhea  No rashes .  No falling  nouti sx.  No recent travel.  No tick  Bite  Had a ? Spider bit right ring finger  A week before pharmacist said local care  Less red not that painful.   No new rashes  ( rash today is reported ly from heat on trunk)  RX  Tylenol alone no antibiotic but dr Rodena Piety had given z pack " in case got fever" .  Impression and Plan:  March 2015 by dr Albertina Senegal 73 year old female with the following issues:  1. 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 She has not really had any severe bacterial infections. She has continued observation at this point and continued to refuse bone marrow biopsy at this point. Right now I am not really seeing any deterioration in her cell lines. Ideally I would like to get a confirmation of no disease in her bone marrow at some point but she has refused as well. In the meantime, I have continued to give her instructions to report to me any temperature of 101 or higher. I have given her a prescription for Z-Pak in case she develops any illness.  2. 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 3. Insomnia and anxiety: on Ativan.   ROS: See pertinent positives and negatives per HPI.neg gI gu see hpi    Past Medical History  Diagnosis Date  . Depression   . Arthritis   . Neutropenia     Family History  Problem Relation Age of Onset  . Heart disease Sister   . Other Sister     Eye problems  . Other Sister     eye problems    History    Social History  . Marital Status: Married    Spouse Name: N/A    Number of Children: N/A  . Years of Education: N/A   Social History Main Topics  . Smoking status: Never Smoker   . Smokeless tobacco: None  . Alcohol Use: No  . Drug Use: No  . Sexual Activity: None   Other Topics Concern  . None   Social History Narrative   Occupation: Self-employed   Married   Regular Exercise    Outpatient Encounter Prescriptions as of 05/12/2014  Medication Sig  . Ascorbic Acid (VITAMIN C) 1000 MG tablet Take 1,000 mg by mouth daily.    . Calcium Carbonate (CALCIUM 500 PO) Take by mouth.    . diphenhydramine-acetaminophen (TYLENOL PM) 25-500 MG TABS Patient takes 1/2 tablet at bedtime  . glucosamine-chondroitin 500-400 MG tablet Take 1 tablet by mouth 3 (three) times daily.    Astrid Drafts CAPS Take 1 each by mouth daily.  Marland Kitchen LORazepam (ATIVAN) 1 MG tablet TAKE ONE TABLET BY MOUTH THREE TIMES DAILY AS NEEDED FOR ANXIETY  . Multiple Vitamin (MULITIVITAMIN WITH MINERALS) TABS Take 1 tablet by  mouth daily.  . predniSONE (DELTASONE) 5 MG tablet Take 7.5 mg by mouth daily. 35m a day  . [DISCONTINUED] azithromycin (ZITHROMAX Z-PAK) 250 MG tablet Take tow tablets on the first day. Then one daily for 4 days.  . [DISCONTINUED] doxycycline (VIBRA-TABS) 100 MG tablet Take 1 tablet (100 mg total) by mouth 2 (two) times daily.    EXAM:  BP 104/64  Pulse 115  Temp(Src) 102.3 F (39.1 C) (Oral)  Ht 5' 4.5" (1.638 m)  Wt 175 lb (79.379 kg)  BMI 29.59 kg/m2  SpO2 95%  Body mass index is 29.59 kg/(m^2).  GENERAL: vitals reviewed and listed above, alert, oriented, appears well hydrated ill appearing  Non toxcic here with husband  HEENT: atraumatic, conjunctiva  clear, no obvious abnormalities on inspection of external nose and ears OP : no lesion edema or exudate tm some wax  NECK: no obvious masses on inspection palpation no adenopathy supple  LUNGS: clear to auscultation bilaterally, no wheezes,  rales or rhonchi,  CV: HRRR, no clubbing cyanosis or  peripheral edema nl cap refill  Abdomen:  Sof,t normal bowel sounds without hepatosplenomegaly, no guarding rebound or masses no CVA tenderness MS: moves all extremities  Skin  Right ring finger   Pink and centra popening  no discharge  Fine rash ( hsuband says normal with heat) on trunk  PSYCH:  Alert  Ambulatory  Nl gait.  Neuro non focal  Lab Results  Component Value Date   WBC 1.2* 03/04/2014   HGB 12.5 03/04/2014   HCT 37.4 03/04/2014   PLT 151 03/04/2014   GLUCOSE 132 03/04/2014   ALT 13 03/04/2014   AST 18 03/04/2014   NA 140 03/04/2014   K 3.8 03/04/2014   CL 101 02/06/2013   CREATININE 1.1 03/04/2014   BUN 20.5 03/04/2014   CO2 25 03/04/2014    ASSESSMENT AND PLAN:  Discussed the following assessment and plan:  Fever and chills  NEUTROPENIA NOS  Rheumatoid arthritis  On prednisone therapy High risk x 3  Age neutropenia  autoimmunie disease  Disc with pt and husband   Blood cultures chest x ray ,  To ED for evaluation and infection work up.  Patient Instructions  Advise  ED now because of fever chills neutropenia on prednisone for autoimmune.  condition and no obv source of fever to treat.     WStandley Brooking Jaston Havens M.D.  Pre visit review using our clinic review tool, if applicable. No additional management support is needed unless otherwise documented below in the visit note. Called triage nurse about pt coming to [place] . Total visit 40 mins > 50% spent counseling and coordinating care

## 2014-05-12 NOTE — ED Provider Notes (Signed)
CSN: [id number]     Arrival date & time 05/12/14  1708 History   First MD Initiated Contact with Patient 05/12/14 1753     Chief Complaint  Patient presents with  . Fever      In addition to what is below. Patient is a 73 year old female with past medical history of rheumatoid arthritis on daily prednisone, depression/anxiety, and unknown etiology of neutropenia who presents from PCPs office 2/2 fever. Patient reports that she's had 2 days of fever and chills. Fever has been as high as 101.4 orally. Intermittent chills also been present. She denies headache, neck pain, sore throat, URI symptoms, chest pain, abdominal pain, or urinary symptoms. Patient states that other than the fever, chills, and fatigue she feels at baseline.     (Consider location/radiation/quality/duration/timing/severity/associated sxs/prior Treatment) Patient is a 73 y.o. female presenting with fever. The history is provided by the patient, the spouse and medical records. No language interpreter was used.  Fever Temp source:  Oral Severity:  Moderate Onset quality:  Gradual Duration:  2 days Timing:  Constant Progression:  Worsening Chronicity:  New Relieved by:  Nothing Worsened by:  Nothing tried   Past Medical History  Diagnosis Date  . Depression   . Arthritis   . Neutropenia    Past Surgical History  Procedure Laterality Date  . Breast surgery      Breast Biopsy, lumpectomy  . Tonsillectomy     Family History  Problem Relation Age of Onset  . Heart disease Sister   . Other Sister     Eye problems  . Other Sister     eye problems   History  Substance Use Topics  . Smoking status: Never Smoker   . Smokeless tobacco: Not on file  . Alcohol Use: No   OB History   Grav Para Term Preterm Abortions TAB SAB Ect Mult Living                 Review of Systems  Constitutional: Positive for fever.  All other systems reviewed and are negative.     Allergies  Augmentin  Home Medications    Prior to Admission medications   Medication Sig Start Date End Date Taking? Authorizing Provider  Ascorbic Acid (VITAMIN C) 1000 MG tablet Take 1,000 mg by mouth daily.      Historical Provider, MD  Calcium Carbonate (CALCIUM 500 PO) Take by mouth.      Historical Provider, MD  diphenhydramine-acetaminophen (TYLENOL PM) 25-500 MG TABS Patient takes 1/2 tablet at bedtime    Historical Provider, MD  glucosamine-chondroitin 500-400 MG tablet Take 1 tablet by mouth 3 (three) times daily.      Historical Provider, MD  [place] Lanius CAPS Take 1 each by mouth daily.    Historical Provider, MD  LORazepam (ATIVAN) 1 MG tablet TAKE ONE TABLET BY MOUTH THREE TIMES DAILY AS NEEDED FOR ANXIETY 05/10/14   Myrtis Ser, NP  Multiple Vitamin (MULITIVITAMIN WITH MINERALS) TABS Take 1 tablet by mouth daily.    Historical Provider, MD  predniSONE (DELTASONE) 5 MG tablet Take 7.5 mg by mouth daily. 7mg  a day    Historical Provider, MD   BP 105/61  Pulse 113  Temp(Src) 100.8 F (38.2 C) (Oral)  Resp 20  Ht 5\' 4"  (1.626 m)  Wt 171 lb (77.565 kg)  BMI 29.34 kg/m2  SpO2 97% Physical Exam  Nursing note and vitals reviewed. Constitutional: She is oriented to person, place, and time. She appears well-developed  and well-nourished.  HENT:  Head: Normocephalic and atraumatic.  Right Ear: External ear normal.  Left Ear: External ear normal.  Nose: Nose normal.  Mouth/Throat: Oropharynx is clear and moist.  Eyes: Conjunctivae are normal. Pupils are equal, round, and reactive to light.  Neck: Normal range of motion. Neck supple. No JVD present. No rigidity. No tracheal deviation present. No Brudzinski's sign and no Kernig's sign noted.  Cardiovascular: Regular rhythm.  Tachycardia present.   Pulmonary/Chest: Effort normal and breath sounds normal. No respiratory distress. She has no wheezes.  Abdominal: Soft. Bowel sounds are normal. She exhibits no distension. There is no tenderness.  Musculoskeletal: Normal  range of motion. She exhibits no edema and no tenderness.  Lymphadenopathy:    She has no cervical adenopathy.  Neurological: She is alert and oriented to person, place, and time. She has normal reflexes. No cranial nerve deficit. Coordination normal.  Skin: Skin is warm and dry.  Psychiatric: She has a normal mood and affect.    ED Course  Procedures (including critical care time) Labs Review Labs Reviewed  CBC - Abnormal; Notable for the following:    WBC 0.9 (*)    Hemoglobin 11.5 (*)    HCT 34.8 (*)    Platelets 88 (*)    All other components within normal limits  COMPREHENSIVE METABOLIC PANEL - Abnormal; Notable for the following:    Sodium 130 (*)    Chloride 95 (*)    Glucose, Bld 126 (*)    Creatinine, Ser 1.12 (*)    Albumin 3.1 (*)    AST 75 (*)    ALT 67 (*)    GFR calc non Af Amer 49 (*)    GFR calc Af Amer 56 (*)    All other components within normal limits  COMPREHENSIVE METABOLIC PANEL - Abnormal; Notable for the following:    Sodium 128 (*)    Chloride 94 (*)    Glucose, Bld 123 (*)    Albumin 3.0 (*)    AST 74 (*)    ALT 65 (*)    GFR calc non Af Amer 51 (*)    GFR calc Af Amer 59 (*)    All other components within normal limits  URINALYSIS, ROUTINE W REFLEX MICROSCOPIC - Abnormal; Notable for the following:    APPearance CLOUDY (*)    Hgb urine dipstick LARGE (*)    Ketones, ur 15 (*)    Protein, ur 30 (*)    Leukocytes, UA SMALL (*)    All other components within normal limits  DIFFERENTIAL - Abnormal; Notable for the following:    Neutrophils Relative % 39 (*)    Monocytes Relative 24 (*)    Neutro Abs 0.4 (*)    Lymphs Abs 0.3 (*)    All other components within normal limits  URINE MICROSCOPIC-ADD ON - Abnormal; Notable for the following:    Squamous Epithelial / LPF MANY (*)    Bacteria, UA FEW (*)    All other components within normal limits  CULTURE, BLOOD (ROUTINE X 2)  CULTURE, BLOOD (ROUTINE X 2)  URINE CULTURE  RESPIRATORY VIRUS  PANEL  CULTURE, BLOOD (ROUTINE X 2)  CULTURE, BLOOD (ROUTINE X 2)  MRSA PCR SCREENING  BASIC METABOLIC PANEL  STREP PNEUMONIAE URINARY ANTIGEN  LEGIONELLA ANTIGEN, URINE  B. BURGDORFI ANTIBODIES  ROCKY MTN SPOTTED FVR AB, IGM-BLOOD  ROCKY MTN SPOTTED FVR AB, IGG-BLOOD  CBC WITH DIFFERENTIAL  I-STAT CG4 LACTIC ACID, ED  Imaging Review Dg Chest Port 1 View  05/12/2014   CLINICAL DATA:  Fever for 2 days.  Low white cell count.  EXAM: PORTABLE CHEST - 1 VIEW  COMPARISON:  None.  FINDINGS: 1838 hrs. AP semi upright portable view of the chest shows diffuse haziness over the left lower lung with preservation of the cardiac and hemidiaphragmatic margins. Right lung is clear. Heart size is normal. Telemetry leads overlie the chest.  IMPRESSION: Haziness over the lower left chest is probably related to positioning although a posterior pleural effusion cannot be completely excluded. Dedicated repeat PA chest x-ray evaluation with lateral is recommended to reassess.   Electronically Signed   By: Kennith Center M.D.   On: 05/12/2014 18:47     EKG Interpretation None      MDM   Final diagnoses:  Fever  Neutropenia  Tachycardia    Patient presents to be emergency department with fever. Vital signs remarkable for a temperature of 100.8, heart rate in the 110s, but otherwise vital signs unremarkable arrival. On exam patient overall appeared well and there is no source of focal infection including no signs of meningismus, no rash, and no abnormal lung sounds. Given patient's history of neutropenia, and SIRS criteria it was felt that a CODE SEPSIS was warranted. Patient was given vancomycin and Zosyn for fever of unknown source. Review of results shows possible PNA, mildly elevated LFTs, hyponatremia, and neutropenia.  Patient's blood pressure also noted to have SBPs in the 90s and given 2 L of IVF.  BPs remained 90s-100s.  Patient admitted to stepdown unit for further evaluation and monitoring.       Johnney Ou, MD 05/12/14 [phone]

## 2014-05-12 NOTE — ED Notes (Signed)
Attempted report x1. 

## 2014-05-12 NOTE — Consult Note (Signed)
ANTIBIOTIC CONSULT NOTE - INITIAL  Pharmacy Consult for Vancomycin and Zosyn Indication: febrile neutropenia  Allergies  Allergen Reactions  . Augmentin [Amoxicillin-Pot Clavulanate] Nausea And Vomiting    Stomach cramping    Patient Measurements: Height: 5\' 4"  (162.6 cm) Weight: 171 lb (77.565 kg) IBW/kg (Calculated) : 54.7  Vital Signs: Temp: 100.8 F (38.2 C) (06/03 1717) Temp src: Oral (06/03 1717) BP: 105/61 mmHg (06/03 1717) Pulse Rate: 113 (06/03 1717) Intake/Output from previous day:   Intake/Output from this shift:    Labs:  Recent Labs  05/12/14 1729  WBC 0.9*  HGB 11.5*  PLT 88*  CREATININE 1.12*   Estimated Creatinine Clearance: 47.1 ml/min (by C-G formula based on Cr of 1.12).  Microbiology: No results found for this or any previous visit (from the past 720 hour(s)).  Medical History: Past Medical History  Diagnosis Date  . Depression   . Arthritis   . Neutropenia    Assessment: 72yof presents from PCP to ED with fevers and chills. She has a history of neutropenia and WBC is 0.9. She was recently bit by a spider and a tick. She will begin empiric vancomycin and zosyn. Renal function is stable.  Goal of Therapy:  Vancomycin trough level 15-20 mcg/ml  Plan:  1) Vancomycin 1g IV x 1 then 750mg  IV q12 2) Zosyn 3.375g IV q8 (4 hour infusion) 3) Follow renal function, cultures, LOT, level as  needed  07/12/14 Jill Moss 05/12/2014,6:17 PM

## 2014-05-12 NOTE — ED Notes (Signed)
Fluids still running. Pt is AO x4. Denies any complaint at this time. Denies pain.

## 2014-05-12 NOTE — ED Notes (Signed)
MD made aware of pt's BP. Pt remains AO x4. No complaints. Family at bedside.

## 2014-05-12 NOTE — ED Notes (Signed)
Pt from PCP dt new fevers/chills, neutropenia with no obvious source. Pt reports generalized body aches with fever/chills since Monday. Pt reports recently being bit by a spider and a tick. Denies pain, N/V/D/, CP or SOB. AO x4. Reports taking 1000mg  0800 today.

## 2014-05-12 NOTE — ED Notes (Signed)
1st set of blood cultures has been drawn. Phlebotomy at bedside to draw 2nd.

## 2014-05-12 NOTE — ED Notes (Signed)
Admitting MD at bedside.

## 2014-05-12 NOTE — ED Notes (Signed)
NOTIFIED DR. ZAVITZ FOR PATIENTS LAB RESULTS OF CG4+ LACTIC ACID ,@18 :55PM ,05/12/2014.

## 2014-05-13 ENCOUNTER — Encounter (HOSPITAL_COMMUNITY): Payer: Self-pay | Admitting: *Deleted

## 2014-05-13 ENCOUNTER — Inpatient Hospital Stay (HOSPITAL_COMMUNITY): Payer: Medicare Other

## 2014-05-13 DIAGNOSIS — D61818 Other pancytopenia: Secondary | ICD-10-CM | POA: Diagnosis present

## 2014-05-13 DIAGNOSIS — IMO0002 Reserved for concepts with insufficient information to code with codable children: Secondary | ICD-10-CM

## 2014-05-13 DIAGNOSIS — F411 Generalized anxiety disorder: Secondary | ICD-10-CM

## 2014-05-13 DIAGNOSIS — M069 Rheumatoid arthritis, unspecified: Secondary | ICD-10-CM

## 2014-05-13 DIAGNOSIS — F3289 Other specified depressive episodes: Secondary | ICD-10-CM

## 2014-05-13 DIAGNOSIS — M25569 Pain in unspecified knee: Secondary | ICD-10-CM

## 2014-05-13 DIAGNOSIS — J069 Acute upper respiratory infection, unspecified: Secondary | ICD-10-CM

## 2014-05-13 DIAGNOSIS — D709 Neutropenia, unspecified: Secondary | ICD-10-CM | POA: Diagnosis present

## 2014-05-13 DIAGNOSIS — M25561 Pain in right knee: Secondary | ICD-10-CM | POA: Diagnosis present

## 2014-05-13 DIAGNOSIS — M25562 Pain in left knee: Secondary | ICD-10-CM

## 2014-05-13 DIAGNOSIS — E871 Hypo-osmolality and hyponatremia: Secondary | ICD-10-CM

## 2014-05-13 DIAGNOSIS — F329 Major depressive disorder, single episode, unspecified: Secondary | ICD-10-CM

## 2014-05-13 DIAGNOSIS — R5081 Fever presenting with conditions classified elsewhere: Secondary | ICD-10-CM

## 2014-05-13 LAB — CBC WITH DIFFERENTIAL/PLATELET
BAND NEUTROPHILS: 0 % (ref 0–10)
Basophils Absolute: 0 10*3/uL (ref 0.0–0.1)
Basophils Relative: 0 % (ref 0–1)
EOS ABS: 0 10*3/uL (ref 0.0–0.7)
Eosinophils Relative: 0 % (ref 0–5)
HEMATOCRIT: 31.7 % — AB (ref 36.0–46.0)
HEMOGLOBIN: 10.4 g/dL — AB (ref 12.0–15.0)
LYMPHS PCT: 0 % — AB (ref 12–46)
Lymphs Abs: 0 10*3/uL — ABNORMAL LOW (ref 0.7–4.0)
MCH: 28 pg (ref 26.0–34.0)
MCHC: 32.8 g/dL (ref 30.0–36.0)
MCV: 85.4 fL (ref 78.0–100.0)
MONO ABS: 0 10*3/uL — AB (ref 0.1–1.0)
MONOS PCT: 0 % — AB (ref 3–12)
Neutrophils Relative %: 0 % — ABNORMAL LOW (ref 43–77)
Platelets: 75 10*3/uL — ABNORMAL LOW (ref 150–400)
RBC: 3.71 MIL/uL — ABNORMAL LOW (ref 3.87–5.11)
RDW: 14.2 % (ref 11.5–15.5)
SMEAR REVIEW: DECREASED
WBC: 0.7 10*3/uL — AB (ref 4.0–10.5)
nRBC: 0 /100 WBC

## 2014-05-13 LAB — BASIC METABOLIC PANEL
BUN: 14 mg/dL (ref 6–23)
CO2: 20 mEq/L (ref 19–32)
Calcium: 8.4 mg/dL (ref 8.4–10.5)
Chloride: 103 mEq/L (ref 96–112)
Creatinine, Ser: 1.04 mg/dL (ref 0.50–1.10)
GFR calc non Af Amer: 53 mL/min — ABNORMAL LOW (ref >90)
GFR, EST AFRICAN AMERICAN: 62 mL/min — AB (ref >90)
Glucose, Bld: 138 mg/dL — ABNORMAL HIGH (ref 70–99)
POTASSIUM: 3.8 meq/L (ref 3.7–5.3)
SODIUM: 135 meq/L — AB (ref 137–147)

## 2014-05-13 LAB — MRSA PCR SCREENING: MRSA by PCR: NEGATIVE

## 2014-05-13 LAB — URINE CULTURE: Colony Count: 50000

## 2014-05-13 LAB — LEGIONELLA ANTIGEN, URINE: Legionella Antigen, Urine: NEGATIVE

## 2014-05-13 MED ORDER — DOXYCYCLINE HYCLATE 100 MG PO TABS
50.0000 mg | ORAL_TABLET | Freq: Two times a day (BID) | ORAL | Status: DC
  Administered 2014-05-13 – 2014-05-14 (×4): 50 mg via ORAL
  Filled 2014-05-13 (×4): qty 0.5
  Filled 2014-05-13: qty 1
  Filled 2014-05-13 (×3): qty 0.5

## 2014-05-13 MED ORDER — LORAZEPAM 1 MG PO TABS
1.0000 mg | ORAL_TABLET | ORAL | Status: AC
  Administered 2014-05-13: 1 mg via ORAL

## 2014-05-13 NOTE — Progress Notes (Signed)
Jill Moss TEAM 1 - Stepdown/ICU TEAM Progress Note  Jill Moss KDX:[id number] DOB: 04-30-1941 DOA: 05/12/2014 PCP: Lottie Dawson, MD  Admit HPI / Brief Narrative: Jill Moss is a 73 y.o. 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 Reportedly this has been stable for decades. The patient has not undergone bone marrow biopsy. To manage this, the patient takes a course of eggs and azithromycin prior to travel or when she feels a respiratory infection developing. She also has chronic rheumatoid arthritis which is managed on prednisone 7.5 mg daily. She was on methotrexate for long time ago but was concerned about side effects and discontinued this medication.  The patient's current symptoms started 2 days ago when she developed fever and chills. It was Monday night i.e. 2 nights prior to admission when the patient awoke with fevers and chills. She took Tylenol alternating with ibuprofen which helped her symptoms considerably. Her fever returned the patient was evaluated by her PCP on the day of admission, who recommended evaluation in the emergency department. The patient really denies any other symptoms. She frankly denies dyspnea, cough. She may have some fatigue that she endorses. The patient denies any dysuria or other urinary changes. She denies diarrhea. She denies sick contacts, stating that she is highly cautious when dealing with individuals who may have infectious illnesses. The patient does relate an interesting history of multiple arthropod bites, one perhaps a spider bite to her right fourth digit, dorsal aspect, as well as a tick bite to her left posterior thigh. She describes that there is a scaled lesion that remains on her right fourth digit but there was no rash or other lesion that followed a tick bite. Patient was started on empiric treatment for sepsis in the setting of  neutropenic fever in the emergency department and admitted for further management.  HPI/Subjective: 6/4 having increasing pain bilateral knees Lt > Rt, difficulty walking long distances. Negative CP/SOB/cough/N./V. states has been on prednisone 7.5 mg daily x4 years for her RA.  Assessment/Plan: Presumed developing sepsis in the setting of neutropenic fever.  6/4 ANC count =0.  -Secondary to Pancytopenia, and spiking fever overnight continue Zosyn + vancomycin   -Recent tick bite; continue doxycycline (Lyme disease, R MSF serologies) -Respiratory viral panel pending  -streptococcus pneumoniae, Legionella urinary antigens pending  -Obtain chest CT -6/4 ID consult in a.m?  Chronic pancytopenia and neutropenia; immunosuppressed status.  -Pancytopenia is of unclear etiology but is followed by Dr.Shadad Roxy Cedar (oncology); will contact in the a.m. and informed him patient has been admitted. -Although per patient, PCP and hematology, the patient has had surprisingly little systemic illness over the course of the time this appears patient has reached a nadir in terms of all cell lines  Chronic use of steroids and possible adrenal suppression; presumably rheumatoid (autoimmune) arthritis. (Dx 1999 with knees and feet most affected) -Patient understands cannot stop steroids abruptly continue stress dose steroids (Solu-Cortef 100 mg TID) while hospitalized. -On discharge would restart patient on prednisone 5 mg daily (down from current 7.5 mg daily) per patient's request to start a tapering regimen.  Increasing bilateral knee pain -Obtain bilateral knee x-ray  Hyponatremia -Resolved continue to monitor  Anxiety -Ativan 1 mg QID PRN    Code Status: FULL Family Communication: no family present at time of exam Disposition Plan: Resolution pancytopenia    Consultants: NA  Procedure/Significant Events: 6/4 PCXR;Persistent unchanged hazy opacity over the left lung  base. Atypical  presentation of pneumonia including atypical agents?   Culture 6/3 blood x2 pending 6/3 urine pending 6/3 MRSA by PCR negative 6/3 respiratory viral panel pending 6/4 blood pending 6/4 urine pending   Antibiotics: Zosyn 6/3>> Vancomycin 6/3>> Doxycycline 6/4>>   DVT prophylaxis: Lovenox   Devices NA   LINES / TUBES:  6/3  20 ga left antecubital 6/3  20 ga right antecubital   Continuous Infusions:   Objective: VITAL SIGNS: Temp: 98.9 F (37.2 C) (06/04 1433) Temp src: Oral (06/04 1433) BP: 95/54 mmHg (06/04 1433) Pulse Rate: 88 (06/04 1433) SPO2; FIO2:   Intake/Output Summary (Last 24 hours) at 05/13/14 1942 Last data filed at 05/13/14 1500  Gross per 24 hour  Intake   3460 ml  Output    800 ml  Net   2660 ml     Exam: General: No acute respiratory distress Lungs: Clear to auscultation bilaterally without wheezes or crackles Cardiovascular: Regular rate and rhythm without murmur gallop or rub normal S1 and S2 Renalbalance today;        /overall;        Creatinine ;        Hourly output   Abdomen: Nontender, nondistended, soft, bowel sounds positive, no rebound, no ascites, no appreciable mass Extremities: No significant cyanosis, clubbing, or edema bilateral lower extremities  Data Reviewed: Basic Metabolic Panel:  Recent Labs Lab 05/12/14 1729 05/12/14 1813 05/13/14 0415  NA 130* 128* 135*  K 4.3 4.0 3.8  CL 95* 94* 103  CO2 _0 GLUCOSE 126* 123* 138*  BUN _1 CREATININE 1.12* 1.08 1.04  CALCIUM 8.6 8.6 8.4   Liver Function Tests:  Recent Labs Lab 05/12/14 1729 05/12/14 1813  AST 75* 74*  ALT 67* 65*  ALKPHOS 58 56  BILITOT 0.4 0.3  PROT 7.3 7.2  ALBUMIN 3.1* 3.0*   No results found for this basename: LIPASE, AMYLASE,  in the last 168 hours No results found for this basename: AMMONIA,  in the last 168 hours CBC:  Recent Labs Lab 05/12/14 1729 05/13/14 0415  WBC 0.9* 0.7*  NEUTROABS 0.4*  --   HGB 11.5*  10.4*  HCT 34.8* 31.7*  MCV 85.9 85.4  PLT 88* 75*   Cardiac Enzymes: No results found for this basename: CKTOTAL, CKMB, CKMBINDEX, TROPONINI,  in the last 168 hours BNP (last 3 results) No results found for this basename: PROBNP,  in the last 8760 hours CBG: No results found for this basename: GLUCAP,  in the last 168 hours  Recent Results (from the past 240 hour(s))  MRSA PCR SCREENING     Status: None   Collection Time    05/12/14 11:10 PM      Result Value Ref Range Status   MRSA by PCR NEGATIVE  NEGATIVE Final   Comment:            The GeneXpert MRSA Assay (FDA     approved for NASAL specimens     only), is one component of a     comprehensive MRSA colonization     surveillance program. It is not     intended to diagnose MRSA     infection nor to guide or     monitor treatment for     MRSA infections.     Studies:  Recent x-ray studies have been reviewed in detail by the Attending Physician  Scheduled Meds:  Scheduled Meds: . doxycycline  50 mg Oral  Q12H  . enoxaparin (LOVENOX) injection  40 mg Subcutaneous QHS  . hydrocortisone sod succinate (SOLU-CORTEF) inj  100 mg Intravenous Q8H  . piperacillin-tazobactam (ZOSYN)  IV  3.375 g Intravenous 3 times per day  . sodium chloride  3 mL Intravenous Q12H  . vancomycin  750 mg Intravenous Q12H    Time spent on care of this patient: 40 mins   Allie Bossier , MD   [facility] Office  [phone] Pager [phone]  On-Call/Text Page:      [email or web address]      password [email or web address]  If 7PM-7AM, please contact night-coverage [email or web address] Password [email or web address] 05/13/2014, 7:42 PM   LOS: 1 day

## 2014-05-13 NOTE — Progress Notes (Signed)
Utilization review completed.  

## 2014-05-14 DIAGNOSIS — R740 Nonspecific elevation of levels of transaminase and lactic acid dehydrogenase [LDH]: Secondary | ICD-10-CM

## 2014-05-14 DIAGNOSIS — R7402 Elevation of levels of lactic acid dehydrogenase (LDH): Secondary | ICD-10-CM

## 2014-05-14 DIAGNOSIS — M1711 Unilateral primary osteoarthritis, right knee: Secondary | ICD-10-CM | POA: Insufficient documentation

## 2014-05-14 DIAGNOSIS — M1712 Unilateral primary osteoarthritis, left knee: Secondary | ICD-10-CM | POA: Insufficient documentation

## 2014-05-14 DIAGNOSIS — IMO0002 Reserved for concepts with insufficient information to code with codable children: Secondary | ICD-10-CM

## 2014-05-14 DIAGNOSIS — M1710 Unilateral primary osteoarthritis, unspecified knee: Secondary | ICD-10-CM

## 2014-05-14 LAB — HEPATITIS PANEL, ACUTE
HCV Ab: NEGATIVE
HEP A IGM: NONREACTIVE
HEP B S AG: NEGATIVE
Hep B C IgM: NONREACTIVE

## 2014-05-14 LAB — CBC WITH DIFFERENTIAL/PLATELET
BASOS PCT: 4 % — AB (ref 0–1)
Basophils Absolute: 0 10*3/uL (ref 0.0–0.1)
EOS PCT: 0 % (ref 0–5)
Eosinophils Absolute: 0 10*3/uL (ref 0.0–0.7)
HEMATOCRIT: 29.3 % — AB (ref 36.0–46.0)
HEMOGLOBIN: 10 g/dL — AB (ref 12.0–15.0)
LYMPHS ABS: 0.5 10*3/uL — AB (ref 0.7–4.0)
Lymphocytes Relative: 47 % — ABNORMAL HIGH (ref 12–46)
MCH: 28.7 pg (ref 26.0–34.0)
MCHC: 34.1 g/dL (ref 30.0–36.0)
MCV: 84 fL (ref 78.0–100.0)
MONO ABS: 0.2 10*3/uL (ref 0.1–1.0)
MONOS PCT: 24 % — AB (ref 3–12)
Neutro Abs: 0.2 10*3/uL — ABNORMAL LOW (ref 1.7–7.7)
Neutrophils Relative %: 25 % — ABNORMAL LOW (ref 43–77)
PLATELETS: 63 10*3/uL — AB (ref 150–400)
RBC: 3.49 MIL/uL — ABNORMAL LOW (ref 3.87–5.11)
RDW: 13.9 % (ref 11.5–15.5)
WBC: 0.9 10*3/uL — CL (ref 4.0–10.5)

## 2014-05-14 LAB — COMPREHENSIVE METABOLIC PANEL
ALT: 73 U/L — ABNORMAL HIGH (ref 0–35)
AST: 61 U/L — AB (ref 0–37)
Albumin: 2.6 g/dL — ABNORMAL LOW (ref 3.5–5.2)
Alkaline Phosphatase: 61 U/L (ref 39–117)
BILIRUBIN TOTAL: 0.3 mg/dL (ref 0.3–1.2)
BUN: 13 mg/dL (ref 6–23)
CALCIUM: 8.3 mg/dL — AB (ref 8.4–10.5)
CHLORIDE: 100 meq/L (ref 96–112)
CO2: 23 mEq/L (ref 19–32)
Creatinine, Ser: 1.12 mg/dL — ABNORMAL HIGH (ref 0.50–1.10)
GFR calc Af Amer: 56 mL/min — ABNORMAL LOW (ref >90)
GFR calc non Af Amer: 49 mL/min — ABNORMAL LOW (ref >90)
Glucose, Bld: 129 mg/dL — ABNORMAL HIGH (ref 70–99)
Potassium: 3.2 mEq/L — ABNORMAL LOW (ref 3.7–5.3)
Sodium: 135 mEq/L — ABNORMAL LOW (ref 137–147)
TOTAL PROTEIN: 6.4 g/dL (ref 6.0–8.3)

## 2014-05-14 LAB — RESPIRATORY VIRUS PANEL
ADENOVIRUS: NOT DETECTED
INFLUENZA A H3: NOT DETECTED
Influenza A H1: NOT DETECTED
Influenza A: NOT DETECTED
Influenza B: NOT DETECTED
METAPNEUMOVIRUS: NOT DETECTED
Parainfluenza 1: NOT DETECTED
Parainfluenza 2: NOT DETECTED
Parainfluenza 3: NOT DETECTED
RESPIRATORY SYNCYTIAL VIRUS B: NOT DETECTED
Respiratory Syncytial Virus A: NOT DETECTED
Rhinovirus: NOT DETECTED

## 2014-05-14 LAB — B. BURGDORFI ANTIBODIES: B burgdorferi Ab IgG+IgM: 0.26 {ISR}

## 2014-05-14 LAB — URINE CULTURE
Colony Count: NO GROWTH
Culture: NO GROWTH

## 2014-05-14 LAB — ROCKY MTN SPOTTED FVR AB, IGG-BLOOD: RMSF IgG: 0.12 IV

## 2014-05-14 LAB — ROCKY MTN SPOTTED FVR AB, IGM-BLOOD: RMSF IgM: 0.09 IV (ref 0.00–0.89)

## 2014-05-14 NOTE — ED Provider Notes (Signed)
Medical screening examination/treatment/procedure(s) were conducted as a shared visit with non-physician practitioner(s) or resident and myself. I personally evaluated the patient during the encounter and agree with the findings and plan unless otherwise indicated.  I have personally reviewed any xrays and/ or EKG's with the provider and I agree with interpretation.  Patient with history of rheumatoid arthritis on prednisone and neutropenia of unknown cause presents with fevers since Monday. Patient has had generalized body aches but otherwise feels well. Patient has had neutropenia for years and intermittent fevers with close followup with oncology. Patient denies respiratory or urinary symptoms. Patient tolerating by mouth at home. On exam mild diabetes membranes, sinus tachycardia, warm skin, neck supple no meningismus, abdomen soft nontender, no concerning rashes, lungs clear bilateral. With neutropenia and fever plan for brought antibiotics, fluid bolus, septic workup and admission. Although patient asking to be discharged we explained the potential severity of her presentation and she did agree to come in the hospital.   Neutropenia, fever, dehydration   Enid Skeens, MD 05/14/14 [id number]

## 2014-05-14 NOTE — Care Management Note (Signed)
    Page 1 of 1   05/14/2014     12:34:54 PM CARE MANAGEMENT NOTE 05/14/2014  Patient:  Jill Moss, Jill Moss   Account Number:  [id number]  Date Initiated:  05/13/2014  Documentation initiated by:  Donn Pierini  Subjective/Objective Assessment:   Pt admitted with sepsis     Action/Plan:   PTA pt lived at home with spouse- NCM to follow for d/c needs.  PCP- Fabian Sharp, WANDA K   Anticipated DC Date:  05/15/2014   Anticipated DC Plan:  HOME/SELF CARE      DC Planning Services  CM consult      Choice offered to / List presented to:             Status of service:  In process, will continue to follow Medicare Important Message given?  YES (If response is "NO", the following Medicare IM given date fields will be blank) Date Medicare IM given:  05/14/2014 Date Additional Medicare IM given:    Discharge Disposition:    Per UR Regulation:  Reviewed for med. necessity/level of care/duration of stay  If discussed at Long Length of Stay Meetings, dates discussed:    Comments:

## 2014-05-14 NOTE — Progress Notes (Signed)
Report called to Joan,RN on unit 6 [place], pt transported via wheelchair with belongings with family, to room [phone].

## 2014-05-14 NOTE — Progress Notes (Signed)
[place] TEAM 1 - Stepdown/ICU TEAM Progress Note  Jill Moss BUL:[id number] DOB: [birth year] DOA: 05/12/2014 PCP: Lottie Dawson, MD  Admit HPI / Brief Narrative: Jill Moss is a 73 y.o. 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 Reportedly this has been stable for decades. The patient has not undergone bone marrow biopsy. To manage this, the patient takes a course of eggs and azithromycin prior to travel or when she feels a respiratory infection developing. She also has chronic rheumatoid arthritis which is managed on prednisone 7.5 mg daily. She was on methotrexate for long time ago but was concerned about side effects and discontinued this medication.  The patient's current symptoms started 2 days ago when she developed fever and chills. It was Monday night i.e. 2 nights prior to admission when the patient awoke with fevers and chills. She took Tylenol alternating with ibuprofen which helped her symptoms considerably. Her fever returned the patient was evaluated by her PCP on the day of admission, who recommended evaluation in the emergency department. The patient really denies any other symptoms. She frankly denies dyspnea, cough. She may have some fatigue that she endorses. The patient denies any dysuria or other urinary changes. She denies diarrhea. She denies sick contacts, stating that she is highly cautious when dealing with individuals who may have infectious illnesses. The patient does relate an interesting history of multiple arthropod bites, one perhaps a spider bite to her right fourth digit, dorsal aspect, as well as a tick bite to her left posterior thigh. She describes that there is a scaled lesion that remains on her right fourth digit but there was no rash or other lesion that followed a tick bite. Patient was started on empiric treatment for sepsis in the setting of  neutropenic fever in the emergency department and admitted for further management.  HPI/Subjective: 6/4 having increasing pain bilateral knees Lt > Rt, difficulty walking long distances. Negative CP/SOB/cough/N./V. states has been on prednisone 7.5 mg daily x4 years for her RA.  Assessment/Plan: Presumed developing sepsis in the setting of neutropenic fever.  6/5 ANC count =270.  -Secondary to Pancytopenia, and spiking fever overnight continue Zosyn + vancomycin   -Recent tick bite; continue doxycycline (Lyme disease, R MSF serologies) -Respiratory viral panel pending  -streptococcus pneumoniae, Legionella urinary antigens pending  -chest CT; see results below  Chronic pancytopenia and neutropenia; immunosuppressed status.  -Pancytopenia is of unclear etiology but is followed by Dr.Shadad Roxy Cedar (oncology);  -Although per patient, PCP and hematology, the patient has had surprisingly little systemic illness over the course of the time this appears patient has reached a nadir in terms of all cell lines -6/5 spoke with Dr.Shadad Roxy Cedar (oncology) and he recommended that patient afebrile over the next 24-48 hours, and negative source of infection identified should be okay to discharge.  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 -On discharge would restart patient on prednisone 5 mg daily (down from current 7.5 mg daily) per patient's request to start a tapering regimen.  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 Atypical presentation of pneumonia including atypical agents? 6/4 CT chest without contrast - Opacity projecting in the left hemi thorax on recent CXR due to overlying left breast prosthesis, -internal mammary chain adenopathy, with 2 nodes measuring 11 mm in short axis. 1 adjacent prominent anterior mediastinal lymph node.  -Minor subsegmental atelectasis in the lungs and scarring at apices.  -Findings upper abdomen c/w cirrhosis; borderline splenomegaly. Mild gastrohepatic ligament adenopathy likely reactive. 6/4 DG right knee;Mild to moderate tricompartmental degenerative change of knee, worse medial compartment.  6/4 DG left knee;Severe degenerative joint disease is noted medially.     Culture 6/3 [facility] spotted fever pending 6/3 Lyme pending  6/3 urine strep pneumo negative 6/3 urine Legionella negative 6/3 blood right arm/left arm NTD  6/3 urine NTD (suggested recollection)  6/3 MRSA by PCR negative 6/3 respiratory viral panel pending 6/4 blood right hand NTD 6/4 urine pending   Antibiotics: Zosyn 6/3>> Vancomycin 6/3>> Doxycycline 6/4>>   DVT prophylaxis: Lovenox   Devices NA   LINES / TUBES:  6/3  20 ga left antecubital 6/3  20 ga right antecubital   Continuous Infusions:   Objective: VITAL SIGNS: Temp: 100.4 F (38 C) (06/05 0725) Temp src: Oral (06/05 0725) BP: 114/57 mmHg (06/05 0725) Pulse Rate: 83 (06/05 0725) SPO2; FIO2:   Intake/Output Summary (Last 24 hours) at 05/14/14 1105 Last data filed at 05/14/14 0600  Gross per 24 hour  Intake    780 ml  Output    200 ml  Net    580 ml     Exam: General: A./O. x4, NAD, No acute respiratory distress Lungs: Clear to auscultation  bilaterally without wheezes or crackles Cardiovascular: Regular rate and rhythm without murmur gallop or rub normal S1 and S2 Abdomen: Nontender, nondistended, soft, bowel sounds positive, no rebound, no ascites, no appreciable mass Extremities: No significant cyanosis, clubbing, or edema bilateral lower extremities  Data Reviewed: Basic Metabolic Panel:  Recent Labs Lab 05/12/14 1729 05/12/14 1813 05/13/14 0415 05/14/14 0322  NA 130* 128* 135* 135*  K 4.3 4.0 3.8 3.2*  CL 95* 94* 103 100  CO2 23 22 20 23   GLUCOSE 126* 123* 138* 129*  BUN 18 17 14 13   CREATININE 1.12* 1.08 1.04 1.12*  CALCIUM 8.6 8.6 8.4 8.3*   Liver Function Tests:  Recent Labs Lab 05/12/14 1729 05/12/14 1813 05/14/14 0322  AST 75* 74* 61*  ALT 67* 65* 73*  ALKPHOS 58 56 61  BILITOT 0.4 0.3 0.3  PROT 7.3 7.2 6.4  ALBUMIN 3.1* 3.0* 2.6*   No results found for this basename: LIPASE, AMYLASE,  in the last 168 hours No results found for this basename: AMMONIA,  in the last 168 hours CBC:  Recent Labs Lab 05/12/14 1729 05/13/14 0415 05/14/14 0322  WBC 0.9* 0.7* 0.9*  NEUTROABS 0.4*  --  0.2*  HGB 11.5* 10.4* 10.0*  HCT 34.8* 31.7* 29.3*  MCV 85.9 85.4 84.0  PLT 88* 75* 63*   Cardiac Enzymes: No results found for this basename: CKTOTAL, CKMB, CKMBINDEX, TROPONINI,  in the last 168 hours BNP (last 3 results) No results found for this basename: PROBNP,  in the last 8760 hours CBG: No results found for this basename: GLUCAP,  in the last 168 hours  Recent Results (from the past 240 hour(s))  CULTURE, BLOOD (ROUTINE X 2)     Status: None   Collection Time    05/12/14  6:13 PM      Result Value Ref Range Status   Specimen Description BLOOD RIGHT ARM   Final   Special Requests BOTTLES DRAWN AEROBIC AND ANAEROBIC B 10CC R [place]   Final   Culture  Setup Time     Final   Value: 05/13/2014 00:43     Performed at [facility]   Culture     Final   Value:        BLOOD CULTURE RECEIVED NO  GROWTH TO DATE CULTURE WILL BE HELD FOR 5 DAYS BEFORE ISSUING A FINAL NEGATIVE REPORT     Performed at [facility]   Report Status PENDING   Incomplete  CULTURE, BLOOD (ROUTINE X 2)     Status: None   Collection Time    05/12/14  6:20 PM      Result Value Ref Range Status   Specimen Description BLOOD LEFT ARM   Final   Special Requests BOTTLES DRAWN AEROBIC AND ANAEROBIC 10CC   Final   Culture  Setup Time     Final   Value: 05/13/2014 00:43     Performed at [facility]   Culture     Final   Value:        BLOOD CULTURE RECEIVED NO GROWTH TO DATE CULTURE WILL BE HELD FOR 5 DAYS BEFORE ISSUING A FINAL NEGATIVE REPORT     Performed at [facility]   Report Status PENDING   Incomplete  URINE CULTURE     Status: None   Collection Time    05/12/14  6:33 PM      Result Value Ref Range Status   Specimen Description URINE, CLEAN CATCH   Final   Special Requests NONE   Final   Culture  Setup Time     Final   Value: 05/13/2014 00:40     Performed at [facility] Count     Final   Value: 50,000 COLONIES/ML     Performed at [facility]   Culture     Final   Value: Multiple bacterial morphotypes present, none predominant. Suggest appropriate recollection if clinically indicated.     Performed at [facility]   Report Status 05/13/2014 FINAL   Final  MRSA PCR SCREENING     Status: None   Collection Time    05/12/14 11:10 PM      Result Value Ref Range Status   MRSA by PCR NEGATIVE  NEGATIVE Final   Comment:            The GeneXpert MRSA Assay (FDA     approved for NASAL specimens     only), is one component of a     comprehensive MRSA colonization     surveillance program. It is not     intended to diagnose MRSA     infection nor to guide or     monitor treatment for     MRSA infections.  CULTURE, BLOOD (SINGLE)     Status: None   Collection Time    05/13/14  9:26 AM      Result Value Ref Range Status   Specimen  Description BLOOD RIGHT HAND   Final   Special Requests BOTTLES DRAWN AEROBIC AND ANAEROBIC 10MLS   Final   Culture  Setup Time     Final   Value: 05/13/2014 12:07     Performed at [facility]   Culture     Final   Value:        BLOOD CULTURE RECEIVED NO GROWTH TO DATE CULTURE WILL BE HELD FOR 5 DAYS BEFORE ISSUING A FINAL NEGATIVE REPORT     Performed at [facility]   Report Status PENDING   Incomplete     Studies:  Recent x-ray studies have been reviewed in detail by the Attending Physician  Scheduled Meds:  Scheduled Meds: . doxycycline  50 mg Oral Q12H  . enoxaparin (LOVENOX) injection  40 mg Subcutaneous QHS  . hydrocortisone sod succinate (SOLU-CORTEF) inj  100 mg Intravenous Q8H  . piperacillin-tazobactam (ZOSYN)  IV  3.375 g Intravenous 3 times per day  . sodium chloride  3 mL Intravenous Q12H  . vancomycin  750 mg Intravenous Q12H    Time spent on care of this patient: 40 mins   Allie Bossier , MD   [facility] Office  [phone] Pager [phone]  On-Call/Text Page:      [email or web address]      password [email or web address]  If 7PM-7AM, please contact night-coverage [email or web address] Password [email or web address] 05/14/2014, 11:05 AM   LOS: 2 days

## 2014-05-14 NOTE — Progress Notes (Signed)
Medicare IM given to pt and signed copy placed in shadow chart- dated 05/14/14-1215

## 2014-05-15 DIAGNOSIS — E876 Hypokalemia: Secondary | ICD-10-CM | POA: Diagnosis not present

## 2014-05-15 LAB — COMPREHENSIVE METABOLIC PANEL
ALBUMIN: 2.7 g/dL — AB (ref 3.5–5.2)
ALT: 68 U/L — ABNORMAL HIGH (ref 0–35)
AST: 54 U/L — ABNORMAL HIGH (ref 0–37)
Alkaline Phosphatase: 59 U/L (ref 39–117)
BUN: 16 mg/dL (ref 6–23)
CO2: 23 mEq/L (ref 19–32)
Calcium: 8.4 mg/dL (ref 8.4–10.5)
Chloride: 102 mEq/L (ref 96–112)
Creatinine, Ser: 0.98 mg/dL (ref 0.50–1.10)
GFR calc non Af Amer: 57 mL/min — ABNORMAL LOW (ref >90)
GFR, EST AFRICAN AMERICAN: 66 mL/min — AB (ref >90)
GLUCOSE: 144 mg/dL — AB (ref 70–99)
Potassium: 3 mEq/L — ABNORMAL LOW (ref 3.7–5.3)
Sodium: 139 mEq/L (ref 137–147)
TOTAL PROTEIN: 6.7 g/dL (ref 6.0–8.3)
Total Bilirubin: 0.3 mg/dL (ref 0.3–1.2)

## 2014-05-15 LAB — CBC WITH DIFFERENTIAL/PLATELET
BASOS PCT: 6 % — AB (ref 0–1)
Basophils Absolute: 0.1 10*3/uL (ref 0.0–0.1)
Eosinophils Absolute: 0 10*3/uL (ref 0.0–0.7)
Eosinophils Relative: 0 % (ref 0–5)
HEMATOCRIT: 29.9 % — AB (ref 36.0–46.0)
HEMOGLOBIN: 9.9 g/dL — AB (ref 12.0–15.0)
LYMPHS PCT: 55 % — AB (ref 12–46)
Lymphs Abs: 0.9 10*3/uL (ref 0.7–4.0)
MCH: 27.5 pg (ref 26.0–34.0)
MCHC: 33.1 g/dL (ref 30.0–36.0)
MCV: 83.1 fL (ref 78.0–100.0)
MONOS PCT: 28 % — AB (ref 3–12)
Monocytes Absolute: 0.4 10*3/uL (ref 0.1–1.0)
NEUTROS ABS: 0.2 10*3/uL — AB (ref 1.7–7.7)
Neutrophils Relative %: 11 % — ABNORMAL LOW (ref 43–77)
Platelets: 74 10*3/uL — ABNORMAL LOW (ref 150–400)
RBC: 3.6 MIL/uL — ABNORMAL LOW (ref 3.87–5.11)
RDW: 13.8 % (ref 11.5–15.5)
WBC: 1.6 10*3/uL — AB (ref 4.0–10.5)

## 2014-05-15 MED ORDER — LEVOFLOXACIN 750 MG PO TABS: 750.0000 mg | ORAL_TABLET | Freq: Every day | ORAL | Status: DC

## 2014-05-15 MED ORDER — POTASSIUM CHLORIDE CRYS ER 20 MEQ PO TBCR: 40.0000 meq | EXTENDED_RELEASE_TABLET | Freq: Once | ORAL | Status: DC

## 2014-05-15 NOTE — Progress Notes (Signed)
Pt for discharge to home accomp by husband.  Given Rx for Levaquin and reviewed.  All discharge instructions given and reviewed and pt given copy.  Husband understands instructions as well.  Dr. Clelia Croft called and spoke to the pt in the room about follow up.  FU appts given.

## 2014-05-15 NOTE — Progress Notes (Signed)
Events noted. Chart reviewed and I spoke with the patient over the phone. I also discussed her case with the treating hospitalist team.  She is clinically stable but she needs a bone marrow biopsy which she continues to refuse. I fear that she might have developed a serious hematological disorder such as MDS or leukemia. I will arrange an earlier follow up and continue to discuss this with her. I agree with antibiotics choice and will follow up on her LFTs as outpatient.

## 2014-05-15 NOTE — Discharge Summary (Signed)
Physician Discharge Summary  Jill Moss ELF:[id number] DOB: [birth year] DOA: 05/12/2014  PCP: Lottie Dawson, MD  Admit date: 05/12/2014 Discharge date: 05/15/2014  Time spent: 40  minutes  Recommendations for Outpatient Follow-up:  1. Discharge home on oral levaquin to complete a 10 day course of antibiotics 2. Follow up with PCP in 1 week 3.  follow up with rheumatologist and hematologist as scheduled  Discharge Diagnoses:  Principal Problem:   Neutropenic fever   Active Problems:   Sepsis   Rheumatoid arthritis   pancytopenia   Hyponatremia   Bilateral knee pain   Hypokalemia Anxiety and depression   Discharge Condition: fair  Diet recommendation: regular  Filed Weights   05/12/14 1717  Weight: 77.565 kg (171 lb)    History of present illness:  Please refer to admission H&P for details, but in brief, 73 y.o. 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 Reportedly this has been stable for decades. The patient has not undergone bone marrow biopsy. To manage this, the patient takes a course of eggs and azithromycin prior to travel or when she feels a respiratory infection developing. She also has chronic rheumatoid arthritis which is managed on prednisone 7.5 mg daily. She was on methotrexate for long time ago but was concerned about side effects and discontinued this medication.    2 nights prior to admission  the patient awoke with fevers and chills. She took Tylenol alternating with ibuprofen which helped her symptoms considerably. Her fever returned and  the patient was evaluated by her PCP on the day of admission, who recommended evaluation in the emergency department. The patient  denied any other symptoms. denies dyspnea, cough.dysuria or  Diarrhea. She denies sick contacts, stating that she is highly cautious when dealing with individuals who may have  infectious illnesses. She reports spider bite over right fourth digit recently.. Patient was started on empiric treatment for sepsis in the setting of neutropenic fever in the emergency department and admitted for further management.  Patient had mild sinus tachycardia and borderline low BP on admission. She was admitted to stepdown for early sepsis and cultures sent. Blood work done showed pancytopenia which she has at baseline.     Hospital Course:  Sepsis with neutropenic fever.  Patient had Tmax of 102.3 on 6/4. Has been afebrile for over 24 hrs. Has baseline pancytopenia with ANC of 170 this am.  -placed on empiric Zosyn + vancomycin  -?Recent tick bite, placed on empiric doxycycline (Lyme disease and  R MSF serologies negative) . Left thigh ( bite area) looks normal. Will d/c doxycycline. -streptococcus pneumoniae, Legionella urinary antigens negative -chest CT done on admission showed no acute infection. Showed cirrhotic changes in liver with splenomegaly.  Patient clinically stable and i will discharge her on oral levaquin for 8 more days to compete a 10 day course. All cultures so far negative.  Does not need doxycycline as w/up has been negative.    Chronic pancytopenia and neutropenia; immunosuppressed status.  -Pancytopenia is of unclear etiology but is followed by Dr.Shadad Roxy Cedar (oncology);  -Although per patient, PCP and hematology, the patient has had surprisingly little systemic illness over the course of the time  - spoke with Dr Alen Blew today and recommended patient could be discharged home if remained afebrile and stable. He plans on bone marrow bx as outpt.  Rheumatoid arthritis / osteoarthritis  -on chronic prednisone and follows with rheumatologist  -  resume home dose prednisone. Has follow up with her rheum in july  Mild transaminitis (new)  - acute hepatitis panel negative. Cirrhotic changes seen on CT scab with splenomegaly. Needs folow up as outpt.   DJD bilateral  knees (Increasing bilateral knee pain)  X rays of knees shows degenerative changes. Pain better now. Continue prn tylenol  Hyponatremia  -Resolved continue to monitor   Anxiety  -Ativan 1 mg QID PRN   Code Status: FULL  Family Communication: husband at bedside Disposition Plan: home with outpt PCP and heme follow     Cultures  6/3 [facility] spotted fever negative 6/3 Lyme negative 6/3 urine strep pneumo negative  6/3 urine Legionella negative  6/3 blood right arm/left arm NTD  6/3 urine NTD (suggested recollection)  6/3 MRSA by PCR negative  6/3 respiratory viral panel pending  6/4 blood right hand NTD  6/4 urine cx  negative  Antibiotics:  Zosyn 6/3>>  Vancomycin 6/3>>  Doxycycline 6/4>>   Procedures: None   Consultations:   spoke with Dr Alen Blew over the phone  Discharge Exam: Filed Vitals:   05/15/14 0610  BP: 121/64  Pulse: 80  Temp: 97.4 F (36.3 C)  Resp: 18    General: elderly female in NAD HEENT: no pallor, moist oral mucosa, no thrush Chest: clear b/l, no added sounds CVS: NS1&S2, no MRG Abd: soft, NT, ND BS+ Ext: warm, no edema  CNS: AAOX3  Discharge Instructions You were cared for by a hospitalist during your hospital stay. If you have any questions about your discharge medications or the care you received while you were in the hospital after you are discharged, you can call the unit and asked to speak with the hospitalist on call if the hospitalist that took care of you is not available. Once you are discharged, your primary care physician will handle any further medical issues. Please note that NO REFILLS for any discharge medications will be authorized once you are discharged, as it is imperative that you return to your primary care physician (or establish a relationship with a primary care physician if you do not have one) for your aftercare needs so that they can reassess your need for medications and monitor your lab values.      Medication List         CALCIUM 500 PO  Take by mouth.     diphenhydramine-acetaminophen 25-500 MG Tabs  Commonly known as:  TYLENOL PM  Patient takes 1/2 tablet at bedtime     glucosamine-chondroitin 500-400 MG tablet  Take 1 tablet by mouth 3 (three) times daily.     Krill Oil Caps  Take 1 each by mouth daily.     levofloxacin 750 MG tablet  Commonly known as:  LEVAQUIN  Take 1 tablet (750 mg total) by mouth daily. UNTIL 6/13     LORazepam 1 MG tablet  Commonly known as:  ATIVAN  TAKE ONE TABLET BY MOUTH THREE TIMES DAILY AS NEEDED FOR ANXIETY     multivitamin with minerals Tabs tablet  Take 1 tablet by mouth daily.     predniSONE 5 MG tablet  Commonly known as:  DELTASONE  Take 7.5 mg by mouth daily. 61m a day     vitamin C 1000 MG tablet  Take 1,000 mg by mouth daily.       Allergies  Allergen Reactions  . Augmentin [Amoxicillin-Pot Clavulanate] Nausea And Vomiting    Stomach cramping       Follow-up Information  Follow up with Lottie Dawson, MD. Schedule an appointment as soon as possible for a visit in 1 week.   Specialty:  Internal Medicine   Contact information:   [place] [address] [phone]        The results of significant diagnostics from this hospitalization (including imaging, microbiology, ancillary and laboratory) are listed below for reference.    Significant Diagnostic Studies: Dg Knee 1-2 Views Left  17-May-2014   CLINICAL DATA:  Rheumatoid arthritis.  EXAM: LEFT KNEE - 1-2 VIEW  COMPARISON:  None.  FINDINGS: There is no evidence of fracture, dislocation, or joint effusion. Severe narrowing of medial joint space is noted with osteophyte formation. Soft tissues are unremarkable.  IMPRESSION: Severe degenerative joint disease is noted medially. No acute abnormality seen in the left knee.   Electronically Signed   By: Sabino Dick M.D.   On: May 17, 2014 18:23   Dg Knee 1-2 Views Right  2014-05-17   CLINICAL  DATA:  History of rheumatoid arthritis refractory to immunosuppression, evaluate for destruction  EXAM: RIGHT KNEE - 1-2 VIEW  COMPARISON:  None.  FINDINGS: No fracture or dislocation. Grossly preserved osseus mineralization. There is mild to moderate degenerative change of the knee, worse within the medial compartment with joint space loss, subchondral sclerosis and osteophytosis. There is minimal spurring of the tibial spines. There is very minimal lateral deviation the tibial plateau in relation to the distal femoral condyles. No evidence of chondrocalcinosis. No joint effusion. Regional soft tissues appear normal.  IMPRESSION: Mild to moderate tricompartmental degenerative change of the knee, worse within the medial compartment. No definite evidence of erosive arthropathy.   Electronically Signed   By: Sandi Mariscal M.D.   On: 17-May-2014 18:24   Ct Chest Wo Contrast  05/14/2014   CLINICAL DATA:  Fever, chills.  Abnormal chest radiograph.  EXAM: CT CHEST WITHOUT CONTRAST  TECHNIQUE: Multidetector CT imaging of the chest was performed following the standard protocol without IV contrast.  COMPARISON:  Current chest radiograph.  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 This accounts for the increased opacity projecting in the left hemi thorax on the chest radiograph.  Left lobe of the thyroid gland is prominent extending to the level of the jugular notch. No discrete nodule. No neck base or axillary masses or enlarged lymph nodes.  There is left internal mammary adenopathy. Two discrete nodes are measured both 11 mm in short axis. There is an adjacent prominent anterior mediastinal lymph node measuring 9 mm in short axis. No other mediastinal adenopathy. Heart is normal in size. Great vessels are normal in caliber. No hilar masses or adenopathy.  Minor subsegmental atelectasis noted at the lung bases. No lung consolidation or edema. Minimal scarring noted at the apices. No pleural effusion.  Limited evaluation  of the upper abdomen shows borderline splenomegaly. There morphologic changes of the liver raising suspicion for cirrhosis. There is gastrohepatic ligament adenopathy. The largest node measures 15 mm in short axis.  Mild degenerative changes are noted along the thoracic spine. No osteoblastic or osteolytic lesions.  IMPRESSION: 1. Opacity projecting in the left hemi thorax on the recent chest radiographs is due to an overlying left breast prosthesis, which is from reconstruction following a breast surgery. Although there is no mention of breast carcinoma and history this is suspected. 2. There is internal mammary chain adenopathy, with 2 nodes measuring 11 mm in short axis. There is 1 adjacent prominent anterior mediastinal lymph node. 3. Minor subsegmental atelectasis  in the lungs and scarring at the apices. Lungs are otherwise clear. No evidence of pneumonia. 4. Findings in the upper abdomen are consistent with cirrhosis borderline splenomegaly. Mild gastrohepatic ligament adenopathy is likely reactive.   Electronically Signed   By: Lajean Manes M.D.   On: 05/14/2014 01:31   Dg Chest Port 1 View  05/13/2014   CLINICAL DATA:  temp 102.?infection abnormal chest radiograph yesterday.  EXAM: PORTABLE CHEST - 1 VIEW  COMPARISON:  05/12/2014.  FINDINGS: There is persistent haziness over the left chest which is unchanged compared to yesterday. No focal consolidation. Left basilar atelectasis. There is no lateral view submitted to further assess the density at the left base. Apical lordotic projection is present on today's exam. Cardiopericardial silhouette appears within normal limits for projection. Monitoring leads project over the chest.  IMPRESSION: Persistent unchanged hazy opacity over the left lung base. As noted on yesterday's exam, a lateral view would be helpful in localizing the density over the left lung base and further characterizing. This may represent an atypical presentation of pneumonia including  atypical agents in this neutropenic patient.   Electronically Signed   By: Dereck Ligas M.D.   On: 05/13/2014 09:05   Dg Chest Port 1 View  05/12/2014   CLINICAL DATA:  Fever for 2 days.  Low white cell count.  EXAM: PORTABLE CHEST - 1 VIEW  COMPARISON:  None.  FINDINGS: 1838 hrs. AP semi upright portable view of the chest shows diffuse haziness over the left lower lung with preservation of the cardiac and hemidiaphragmatic margins. Right lung is clear. Heart size is normal. Telemetry leads overlie the chest.  IMPRESSION: Haziness over the lower left chest is probably related to positioning although a posterior pleural effusion cannot be completely excluded. Dedicated repeat PA chest x-ray evaluation with lateral is recommended to reassess.   Electronically Signed   By: Misty Stanley M.D.   On: 05/12/2014 18:47    Microbiology: Recent Results (from the past 240 hour(s))  CULTURE, BLOOD (ROUTINE X 2)     Status: None   Collection Time    05/12/14  6:13 PM      Result Value Ref Range Status   Specimen Description BLOOD RIGHT ARM   Final   Special Requests BOTTLES DRAWN AEROBIC AND ANAEROBIC B 10CC R 6CC   Final   Culture  Setup Time     Final   Value: 05/13/2014 00:43     Performed at [facility]   Culture     Final   Value:        BLOOD CULTURE RECEIVED NO GROWTH TO DATE CULTURE WILL BE HELD FOR 5 DAYS BEFORE ISSUING A FINAL NEGATIVE REPORT     Performed at [facility]   Report Status PENDING   Incomplete  CULTURE, BLOOD (ROUTINE X 2)     Status: None   Collection Time    05/12/14  6:20 PM      Result Value Ref Range Status   Specimen Description BLOOD LEFT ARM   Final   Special Requests BOTTLES DRAWN AEROBIC AND ANAEROBIC 10CC   Final   Culture  Setup Time     Final   Value: 05/13/2014 00:43     Performed at [facility]   Culture     Final   Value:        BLOOD CULTURE RECEIVED NO GROWTH TO DATE CULTURE WILL BE HELD FOR 5 DAYS BEFORE ISSUING A FINAL  NEGATIVE REPORT     Performed at [facility]   Report Status PENDING   Incomplete  URINE CULTURE     Status: None   Collection Time    05/12/14  6:33 PM      Result Value Ref Range Status   Specimen Description URINE, CLEAN CATCH   Final   Special Requests NONE   Final   Culture  Setup Time     Final   Value: 05/13/2014 00:40     Performed at [facility]     Final   Value: 50,000 COLONIES/ML     Performed at [facility]   Culture     Final   Value: Multiple bacterial morphotypes present, none predominant. Suggest appropriate recollection if clinically indicated.     Performed at [facility]   Report Status 05/13/2014 FINAL   Final  MRSA PCR SCREENING     Status: None   Collection Time    05/12/14 11:10 PM      Result Value Ref Range Status   MRSA by PCR NEGATIVE  NEGATIVE Final   Comment:            The GeneXpert MRSA Assay (FDA     approved for NASAL specimens     only), is one component of a     comprehensive MRSA colonization     surveillance program. It is not     intended to diagnose MRSA     infection nor to guide or     monitor treatment for     MRSA infections.  RESPIRATORY VIRUS PANEL     Status: None   Collection Time    05/12/14 11:15 PM      Result Value Ref Range Status   Source - RVPAN NASOPHARYNGEAL   Final   Respiratory Syncytial Virus A NOT DETECTED   Final   Respiratory Syncytial Virus B NOT DETECTED   Final   Influenza A NOT DETECTED   Final   Influenza B NOT DETECTED   Final   Parainfluenza 1 NOT DETECTED   Final   Parainfluenza 2 NOT DETECTED   Final   Parainfluenza 3 NOT DETECTED   Final   Metapneumovirus NOT DETECTED   Final   Rhinovirus NOT DETECTED   Final   Adenovirus NOT DETECTED   Final   Influenza A H1 NOT DETECTED   Final   Influenza A H3 NOT DETECTED   Final   Comment: (NOTE)           Normal Reference Range for each Analyte: NOT DETECTED     Testing performed using the Luminex xTAG  Respiratory Viral Panel test     kit.     This test was developed and its performance characteristics determined     by [facility]. It has not been cleared or approved by the [place]     Food and Drug Administration. This test is used for clinical purposes.     It should not be regarded as investigational or for research. This     laboratory is certified under the [place] (CLIA) as qualified to perform high complexity     clinical laboratory testing.     Performed at [place], BLOOD (SINGLE)     Status: None   Collection Time    05/13/14  9:26 AM      Result  Value Ref Range Status   Specimen Description BLOOD RIGHT HAND   Final   Special Requests BOTTLES DRAWN AEROBIC AND ANAEROBIC 10MLS   Final   Culture  Setup Time     Final   Value: 05/13/2014 12:07     Performed at [facility]   Culture     Final   Value:        BLOOD CULTURE RECEIVED NO GROWTH TO DATE CULTURE WILL BE HELD FOR 5 DAYS BEFORE ISSUING A FINAL NEGATIVE REPORT     Performed at [facility]   Report Status PENDING   Incomplete  URINE CULTURE     Status: None   Collection Time    05/13/14  2:28 PM      Result Value Ref Range Status   Specimen Description URINE, CLEAN CATCH   Final   Special Requests Immunocompromised   Final   Culture  Setup Time     Final   Value: 05/13/2014 19:20     Performed at [facility]     Final   Value: NO GROWTH     Performed at [facility]   Culture     Final   Value: NO GROWTH     Performed at [facility]   Report Status 05/14/2014 FINAL   Final     Labs: Basic Metabolic Panel:  Recent Labs Lab 05/12/14 1729 05/12/14 1813 05/13/14 0415 05/14/14 0322 05/15/14 0511  NA 130* 128* 135* 135* 139  K 4.3 4.0 3.8 3.2* 3.0*  CL 95* 94* 103 100 102  CO2 23 22 20 23 23   GLUCOSE 126* 123* 138* 129* 144*  BUN 18 17 14 13 16   CREATININE 1.12* 1.08 1.04  1.12* 0.98  CALCIUM 8.6 8.6 8.4 8.3* 8.4   Liver Function Tests:  Recent Labs Lab 05/12/14 1729 05/12/14 1813 05/14/14 0322 05/15/14 0511  AST 75* 74* 61* 54*  ALT 67* 65* 73* 68*  ALKPHOS 58 56 61 59  BILITOT 0.4 0.3 0.3 0.3  PROT 7.3 7.2 6.4 6.7  ALBUMIN 3.1* 3.0* 2.6* 2.7*   No results found for this basename: LIPASE, AMYLASE,  in the last 168 hours No results found for this basename: AMMONIA,  in the last 168 hours CBC:  Recent Labs Lab 05/12/14 1729 05/13/14 0415 05/14/14 0322 05/15/14 0511  WBC 0.9* 0.7* 0.9* 1.6*  NEUTROABS 0.4*  --  0.2* 0.2*  HGB 11.5* 10.4* 10.0* 9.9*  HCT 34.8* 31.7* 29.3* 29.9*  MCV 85.9 85.4 84.0 83.1  PLT 88* 75* 63* 74*   Cardiac Enzymes: No results found for this basename: CKTOTAL, CKMB, CKMBINDEX, TROPONINI,  in the last 168 hours BNP: BNP (last 3 results) No results found for this basename: PROBNP,  in the last 8760 hours CBG: No results found for this basename: GLUCAP,  in the last 168 hours     Signed:  Kaslyn Richburg  [facility] 05/15/2014, 9:45 AM

## 2014-05-15 NOTE — Discharge Instructions (Signed)
Food Safety for the Immunocompromised Person Food safety is important for people who are immunocompromised. Immunocompromised means that the immune system is impaired or weakened (as by drugs or illness). This diet is often recommended before and after certain cancer treatments and after an organ or bone marrow transplant. It is important to follow these food safety guidelines for as long as your dietitian or caregiver instructs you to. These food safety guidelines are sometimes called the neutropenic diet or low-microbial diet. Bacteria and other harmful microorganisms are more likely to be present in raw or fresh foods. Thoroughly cooking foods destroys these microorganisms. For example, fresh vegetables should be cooked until tender; meats should be cooked until well-done; and eggs should be cooked until the yolks are firm. Also, certain food products are treated with a method known as pasteurization. Pasteurization briefly exposes food to high heat that kills any bacteria. Look for dairy products, juices, and ciders that have the word "pasteurized" on the label.  GENERAL GUIDELINES  Check expiration dates on all products before you buy them. Nothing you buy should be past the expiration date.  Wash the following items with soap and hot water before and after touching food:  Countertops.  Cutting boards (wash these in a dishwasher if you have one).  All cooking utensils.  All silverware.  All pots and pans.  Before preparing food, wash your hands frequently with warm, soapy water and dry your hands with paper towels. This is especially important after touching raw meat, eggs, and fish.  Wash dishes in hot, soapy water or in a dishwasher. Air dry dishes. Do not use a cloth towel.  Keep perishable food very hot or very cold. Do not leave perishable items at room temperature for more than 10 15 minutes.  All perishable foods should be cooked thoroughly. No rare meat should be eaten.  Wash  fruits and vegetables thoroughly under cold running water before peeling or cutting. Individually scrub produce that has a thick, rough skin or rind, such as lettuce, spinach, or cabbage. Do not use commercial rinses to wash fruits and vegetables.   Packaged salads, slaw mix, and other prepared produce (even marked "prewashed") should be rinsed again under cold running water.   If consuming unpasteurized or fresh tofu, cut tofu into 1 inch (or smaller) cubes. Then, boil the tofu for at least 5 minutes in water or broth before eating or using the tofu in recipes.  Use distilled or bottled water if you are using a water service other than the city water service.  Thaw frozen foods in the refrigerator overnight or quickly in the microwave. Do not thaw food on the countertop.  Refrigerate leftovers promptly in airtight containers.  Use leftovers only if they have been stored properly and have been around for no more than 24 hours.  When food shopping, avoid salad bars, bulk food bins, food samples, and snacks that are out in the open.  When dining out:  Avoid salad bars, delis, and buffets.  Use single-serve condiments, such as ketchup, mustard, mayonnaise, soy sauce, steak sauce, salt, pepper, and sugar. Check with your caregiver after blood work is done to see when your neutropenic diet can be modified with fewer restrictions. SPECIFIC EXAMPLE GUIDELINES Beverages  Allowed: Boiled well water. Bottled spring, distilled, and natural water. Tap water and ice made from bottled or tap water. All canned, bottled, powdered beverages. Instant and brewed coffee, tea; cold-brewed tea made with boiling water. Brewed herbal teas using commercially-packaged tea  bags. Liquid and powdered commercial nutritional supplements. Other beverages not listed below.   Avoid: Unboiled well water.Cold-brewed tea made with warm or cold water. Raw, unpasteurized milk. Unpasteurized fruit and vegetable juices. Mat  tea. Eggnog or milkshakes made with raw eggs. Fresh apple cider.  Meat, Fish, Eggs, Poultry  Allowed: All thoroughly-cooked or canned meats: beef, pork, lamb, poultry, fish, shellfish, game, ham, bacon, sausage, hot dogs. Thoroughly cooked pasteurized egg substitutes and eggs (egg white cooked firm with thickened yellow yolk acceptable). Commercially packaged salami, bologna, and other luncheon meats. Hot dogs should be heated until steaming (165F [73.9C]). Cooked tofu. Prepackaged peanut butter.  Avoid: Uncooked or rare meat, fish, eggs, or poultry. Commercially prepared meat and fish salads. Sushi. Raw or undercooked meat, poultry, fish, game, tofu. Raw or undercooked eggs and egg substitutes. Unheated meats and cold cuts from the deli. Hard cured salami in natural wrap. Cold-smoked salmon, lox. Pickled fish. [place] products. Dairy Products  Allowed: Pasteurized, grade "A" milk or milk products or lactose-free milk or yogurt. Pasteurized yogurt or frozen yogurt. Prepackaged ice cream, sherbet, ice cream bars, homemade milkshakes. Prepackaged and pasteurized hard cheeses, such as cheddar, [place], [place], or [address]. Prepackaged soft cheeses, such as cottage cheese, cream cheese, or ricotta. Dry, refrigerated, and frozen pasteurized whipped topping. Commercial nutritional supplements. Pasteurized eggnog. Pasteurized sour cream.  Avoid: Soft-serve ice cream or frozen yogurt. Hand-packed ice cream or frozen yogurt. Feta, brie, camembert, blue, gorgonzola, Stilton, Roquefort, farmer's cheese, and queso fresco cheeses. Any imported cheeses and any cheese sliced at a deli.Unpasteurized or raw milk cheese, yogurt, and other milk products. Cheeses containing chili peppers or other uncooked vegetables. Breads, Cereals, Rice, Potatoes, Pasta  Allowed: All prepackaged or homemade breads, bagels, rolls, pancakes, sweet rolls, waffles, [place] toast, muffins, cakes, donuts, cookies, crackers. All boxed hot  or cold cereals. Cooked potatoes, rice, noodles, other grains. Potato chips, corn chips, tortilla chips, pretzels, popcorn.  Avoid: Fresh bakery breads, muffins, cakes, donuts, cream or custard filled cakes. Raw grain products. Vegetables and Fruits  Allowed: All frozen, canned, and washed raw vegetables that have been cooked. All cooked or canned fruits. Raw, well-washed and non-bruised fruits. Pasteurized fruit juices. Canned and stewed fruit. Dried fruits.  Avoid: Unwashedraw vegetables and salads. All raw vegetable sprouts (alfalfa, radish, broccoli, mung bean, all others). Salads from the deli.Prepackaged salsas stored in refrigerated case. Unwashed raw fruits. Unpasteurized fruit and vegetable juices. Nuts  Allowed: Processed peanut butter. Canned or bottled roasted nuts. Nuts in baked products.  Avoid:Unroasted raw nuts. Unprocessed nuts. Roasted nuts in the shell. Condiments and Spices  Allowed: All cooked, fresh, or canned spices (add at least 5 minutes before cooking ends). Thoroughly washed fresh herbs and spices. Ketchup, mustard, BBQ sauce, soy sauce, and mayonnaise served in separate containers with clean utensils, refrigerated after opening. Sugar, jelly, and honey served from clean containers with clean utensils.   Avoid: Uncooked spices. Raw honey. Anything from a family container that is not freshly washed.  Desserts  Allowed:Refrigerated commercial and homemade cakes, pies, pastries, and pudding. Refrigerated cream-filled pastries. Homemade and commercial cookies. Shelf-stable cream-filled cupcakes, fruit pies, and canned pudding. Ices, popsicle-like products.  Avoid: Unrefrigerated, cream-filled pastry products (not shelf-stable). Fats  Allowed: Oil, shortening, refrigerated lard, margarine, butter. Commercial or shelf-stable mayonnaise and salad dressings (including cheese-based salad dressings, refrigerated after opening). Cooked gravy and sauces.  Avoid:  Fresh salad dressings containing aged cheese (blue cheese, Roquefort) or raw eggs, stored in refrigerated case. Restaurant Foods and Miscellaneous  Allowed:Thoroughly cooked frozen dinners. Thoroughly cooked frozen pizza. Canned entrees.   Avoid: Eating at restaurants while in neutropenia or using take-out deli food even if it is behind the counter. Avoid all salad bars while you are neutropenic. Avoid all self-serve buffets while you are neutropenic.  Ask your caregiver for information or recommendations regarding poor appetite and weight loss during cancer treatment if needed.  Document Released: 09/23/2007 Document Revised: 05/27/2012 Document Reviewed: 04/11/2012 [facility]Exit[facility] Patient Information 2014 [place], [place].

## 2014-05-17 ENCOUNTER — Telehealth: Payer: Self-pay | Admitting: Family Medicine

## 2014-05-17 ENCOUNTER — Other Ambulatory Visit: Payer: Self-pay | Admitting: Oncology

## 2014-05-17 ENCOUNTER — Other Ambulatory Visit: Payer: Self-pay | Admitting: *Deleted

## 2014-05-17 ENCOUNTER — Telehealth: Payer: Self-pay | Admitting: Oncology

## 2014-05-17 DIAGNOSIS — D709 Neutropenia, unspecified: Secondary | ICD-10-CM

## 2014-05-17 LAB — PATHOLOGIST SMEAR REVIEW

## 2014-05-17 NOTE — Telephone Encounter (Signed)
Message copied by Nils Flack on Mon May 17, 2014 11:49 AM ------      Message from: Madelin Headings      Created: Sun May 16, 2014 10:29 AM      Regarding: will need hosp fu        Jill Moss      Appears that she will need a HOSP FU in a week       I will be out of town  Beginning next Saturday June 14             Either get her in Friday June 13 ( ? SDA at 11 15 and 1130? Or 3 pm which i already blocked )      Or June 22 week.             Also her hematology fu appt isnt until July 28th!!.      Please contact dr Ron Parker al  . I think she should be seen sooner.      Thanks       WP ------

## 2014-05-17 NOTE — Telephone Encounter (Signed)
added appt for 6.9 per Dixie add for 10:30lab 11:00MD..done...she will contact pt

## 2014-05-17 NOTE — Progress Notes (Signed)
Notified patient of appt tomorrow ( 05/18/14 ) lab and dr Clelia Croft, 10:30/11:00. Patient verbalized understanding.

## 2014-05-17 NOTE — Progress Notes (Signed)
I called the patient today for a follow up after her recent discharge.  She feels fatigued but reports no fever or bleeding. I informed her that she needs a sooner follow up than 06/2014 and I would like to see her on 6/9.  She is agreeable to come in the afternoon. I told her that I am concerned that her counts dropped and still recommend a bone marrow biopsy. She informed me that they are going to stay at a beach house and will think about it.  I will discuss this further face to face on 05/18/2014.

## 2014-05-18 ENCOUNTER — Encounter: Payer: Self-pay | Admitting: Oncology

## 2014-05-18 ENCOUNTER — Ambulatory Visit (HOSPITAL_BASED_OUTPATIENT_CLINIC_OR_DEPARTMENT_OTHER): Payer: Medicare Other | Admitting: Oncology

## 2014-05-18 ENCOUNTER — Other Ambulatory Visit (HOSPITAL_BASED_OUTPATIENT_CLINIC_OR_DEPARTMENT_OTHER): Payer: Medicare Other

## 2014-05-18 VITALS — BP 100/85 | HR 94 | Temp 97.7°F | Resp 16 | Wt 172.5 lb

## 2014-05-18 DIAGNOSIS — D61818 Other pancytopenia: Secondary | ICD-10-CM

## 2014-05-18 DIAGNOSIS — M1380 Other specified arthritis, unspecified site: Secondary | ICD-10-CM

## 2014-05-18 DIAGNOSIS — F411 Generalized anxiety disorder: Secondary | ICD-10-CM

## 2014-05-18 DIAGNOSIS — D708 Other neutropenia: Secondary | ICD-10-CM

## 2014-05-18 DIAGNOSIS — D72829 Elevated white blood cell count, unspecified: Secondary | ICD-10-CM

## 2014-05-18 DIAGNOSIS — D709 Neutropenia, unspecified: Secondary | ICD-10-CM

## 2014-05-18 LAB — CBC WITH DIFFERENTIAL/PLATELET
BASO%: 0.9 % (ref 0.0–2.0)
Basophils Absolute: 0 10*3/uL (ref 0.0–0.1)
EOS ABS: 0.1 10*3/uL (ref 0.0–0.5)
EOS%: 1.7 % (ref 0.0–7.0)
HEMATOCRIT: 32.8 % — AB (ref 34.8–46.6)
HGB: 10.8 g/dL — ABNORMAL LOW (ref 11.6–15.9)
LYMPH%: 66 % — AB (ref 14.0–49.7)
MCH: 27.8 pg (ref 25.1–34.0)
MCHC: 32.8 g/dL (ref 31.5–36.0)
MCV: 84.7 fL (ref 79.5–101.0)
MONO#: 0.6 10*3/uL (ref 0.1–0.9)
MONO%: 16.5 % — ABNORMAL HIGH (ref 0.0–14.0)
NEUT%: 14.9 % — AB (ref 38.4–76.8)
NEUTROS ABS: 0.6 10*3/uL — AB (ref 1.5–6.5)
PLATELETS: 159 10*3/uL (ref 145–400)
RBC: 3.87 10*6/uL (ref 3.70–5.45)
RDW: 13.9 % (ref 11.2–14.5)
WBC: 3.8 10*3/uL — AB (ref 3.9–10.3)
lymph#: 2.5 10*3/uL (ref 0.9–3.3)

## 2014-05-18 NOTE — Progress Notes (Signed)
Hematology and Oncology Follow Up Visit  Jill Moss [id number] September 17, 1941 73 y.o. 05/18/2014 11:35 AM    CC: Standley Brooking. Regis Bill, MD  Leary Roca, MD    Principle Diagnosis: This is a 73 year old female with chronic neutropenia with differential diagnosis:  Autoimmune versus idiopathic causes versus a hematological disorder. This was diagnosed about 10 years ago, had been followed here at the [facility] for the last 8 years or so.   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  Interim History:  Jill Moss presents today for a followup visit with her husband. This is a  pleasant 84 year old with chronic neutropenia that really again dates back for at least the last 10 years. Since her last visit, she was hospitalized between  05/12/2014 and 05/15/2014 for neutropenic fever. She reports leading up to that episode, she had at the right as well as a spider bite. She also had significant mold discovered in her house. She presented with periodic fever and shaking chills. She was started intravenous antibiotics and blood cultures did not reveal any pathogen. She was discharged on oral Levaquin and presents today for a followup visit. Overall Jill Moss is doing well now and has not really had any other complaints. She is no longer reporting any fevers, chills or sweats. Her appetite is excellent and feels back to baseline. She has not reported any headaches blurred vision double vision. Did not report a motor sensory neuropathy in her alteration of mental status. Is not reporting any chest pain cough or hemoptysis or hematemesis. She is not reporting any abdominal distention or early satiety. She does not report any frequency urgency or hesitancy. The rest of review of systems unremarkable.  Medications: I have reviewed the patient's current medications.  Current Outpatient Prescriptions  Medication Sig Dispense Refill  . Ascorbic Acid  (VITAMIN C) 1000 MG tablet Take 1,000 mg by mouth daily.        . Calcium Carbonate (CALCIUM 500 PO) Take by mouth.        . diphenhydramine-acetaminophen (TYLENOL PM) 25-500 MG TABS Patient takes 1/2 tablet at bedtime      . glucosamine-chondroitin 500-400 MG tablet Take 1 tablet by mouth 3 (three) times daily.        Astrid Drafts CAPS Take 1 each by mouth daily.      Marland Kitchen levofloxacin (LEVAQUIN) 750 MG tablet Take 1 tablet (750 mg total) by mouth daily.  8 tablet  0  . LORazepam (ATIVAN) 1 MG tablet TAKE ONE TABLET BY MOUTH THREE TIMES DAILY AS NEEDED FOR ANXIETY  90 tablet  0  . Multiple Vitamin (MULITIVITAMIN WITH MINERALS) TABS Take 1 tablet by mouth daily.      . predniSONE (DELTASONE) 5 MG tablet Take 7.5 mg by mouth daily. 25m a day      . traMADol (ULTRAM) 50 MG tablet Take 50 mg by mouth every 6 (six) hours as needed.       No current facility-administered medications for this visit.    Allergies:  Allergies  Allergen Reactions  . Augmentin [Amoxicillin-Pot Clavulanate] Nausea And Vomiting    Stomach cramping    Past Medical History, Surgical history, Social history, and Family History were reviewed and updated.  Physical Exam: Blood pressure 100/85, pulse 94, temperature 97.7 F (36.5 C), temperature source Oral, resp. rate 16, weight 172 lb 8 oz (78.245 kg). ECOG: 0 General appearance: alert awake not in any distress Head: Normocephalic, without  obvious abnormality, atraumatic Neck: no adenopathy, no thyroid masses Lymph nodes: Cervical, supraclavicular, and axillary nodes normal. Heart:regular rate and rhythm, S1, S2 normal, no murmur, click, rub or gallop Lung:chest clear, no wheezing, rales, normal symmetric air entry Abdomin: soft, non-tender, without masses or organomegaly EXT:no erythema, induration, or nodules   Lab Results: Lab Results  Component Value Date   WBC 3.8* 05/18/2014   HGB 10.8* 05/18/2014   HCT 32.8* 05/18/2014   MCV 84.7 05/18/2014   PLT 159 05/18/2014      Chemistry      Component Value Date/Time   NA 139 05/15/2014 0511   NA 140 03/04/2014 1137   K 3.0* 05/15/2014 0511   K 3.8 03/04/2014 1137   CL 102 05/15/2014 0511   CL 101 02/06/2013 1117   CO2 23 05/15/2014 0511   CO2 25 03/04/2014 1137   BUN 16 05/15/2014 0511   BUN 20.5 03/04/2014 1137   CREATININE 0.98 05/15/2014 0511   CREATININE 1.1 03/04/2014 1137      Component Value Date/Time   CALCIUM 8.4 05/15/2014 0511   CALCIUM 9.9 03/04/2014 1137   ALKPHOS 59 05/15/2014 0511   ALKPHOS 51 03/04/2014 1137   AST 54* 05/15/2014 0511   AST 18 03/04/2014 1137   ALT 68* 05/15/2014 0511   ALT 13 03/04/2014 1137   BILITOT 0.3 05/15/2014 0511   BILITOT 0.39 03/04/2014 1137       Impression and Plan: A 73 year old female with the following issues:  1. Neutropenia and lymphocytosis. She is status post recent episode of neutropenic fever. The differential diagnosis was discussed again with the patient and her husband extensively. I continue to remind them of the differential diagnosis includes myelodysplastic syndrome and acute leukemia. Her clinical status today how much improved and probably less likely represents acute leukemia but certainly not completely ruled out. Her laboratory data were discussed today and showed significant improvement in her platelet counts and her hemoglobin. She's continued to have neutropenia and lymphocytosis. I've explained to her the importance of obtaining a bone marrow biopsy which she has continued to refuse. I've explained to her the consequences of missing the diagnosis of leukemia which is a deadly disorder. She feels that she doesn't really want to know at this time in a long as she is feeling well she would like to continue with the current dose. However, if she is more open to considering it and she will think about what I told her today. I have explained to her the logistics of obtaining a bone marrow biopsy which includes CT guidance and sedation. Complications that includes  infection, bleeding and pain were also discussed. For the time being she continue to refuse but is open for consideration at least in immediate future. 2. Autoimmune arthritis.  Continues to be on prednisone. I urged her to followup with the rheumatologist as she is interested in tapering the prednisone off and she is questioning her diagnosis of rheumatoid arthritis. 3. Insomnia and anxiety: on Ativan. It seems to be manageable. 4. Increased liver function tests: CT scan from 05/13/2014 was reviewed and potentially showed early signs of cirrhosis which could be a contributing factor. Her liver function tests today appear to be stable. 5. Followup: Will be in July 2015 for a followup visit     Wyatt Portela, MD 6/9/201511:35 AM

## 2014-05-19 LAB — CULTURE, BLOOD (SINGLE): CULTURE: NO GROWTH

## 2014-05-19 LAB — CULTURE, BLOOD (ROUTINE X 2)
CULTURE: NO GROWTH
Culture: NO GROWTH

## 2014-05-19 NOTE — Telephone Encounter (Signed)
Please schedule the patient per WP's directions.  Pt saw Dr. Ascencion Moss on 05/18/14.  Note in Epic.

## 2014-05-19 NOTE — Telephone Encounter (Signed)
Lm on home phone and cell phone for pt to cb abd sch appt.  Will try again shortly.

## 2014-05-19 NOTE — Telephone Encounter (Signed)
Continue to call pt.

## 2014-05-20 ENCOUNTER — Telehealth: Payer: Self-pay | Admitting: Internal Medicine

## 2014-05-20 NOTE — Telephone Encounter (Signed)
Great! I would like to see her in the next month .( 30 minutes) No rush since she has seen Dr Ascencion Dike

## 2014-05-20 NOTE — Telephone Encounter (Signed)
PT HUS is calling to report he is taking his wife to beach and she had a good report from Dr Juliette Alcide. Pt will call next wk for appt if needed

## 2014-05-24 ENCOUNTER — Telehealth: Payer: Self-pay | Admitting: Internal Medicine

## 2014-05-24 NOTE — Telephone Encounter (Addendum)
Pt is needing a post hospital fu pt was in the hospital on 05/19/14 states she had a temp of 103 and was in ICU, pt states she needs to be seen the week of 05/31/14. Ok to use sda. Where can I work her in.

## 2014-05-24 NOTE — Telephone Encounter (Signed)
Please look at Dr. Durene Fruits note from 05/20/14.  Thanks!

## 2014-05-26 NOTE — Telephone Encounter (Signed)
Please document when the pt is scheduled to see Dr. Fabian Sharp.

## 2014-06-02 NOTE — Telephone Encounter (Signed)
appt scheduled for pt 06/18/14 at 1:15

## 2014-06-03 ENCOUNTER — Other Ambulatory Visit: Payer: Self-pay | Admitting: *Deleted

## 2014-06-03 MED ORDER — LORAZEPAM 1 MG PO TABS: ORAL_TABLET | ORAL | Status: DC

## 2014-06-09 NOTE — Telephone Encounter (Signed)
Patient scheduled for 06/18/14

## 2014-06-18 ENCOUNTER — Ambulatory Visit (INDEPENDENT_AMBULATORY_CARE_PROVIDER_SITE_OTHER): Payer: Medicare Other | Admitting: Internal Medicine

## 2014-06-18 ENCOUNTER — Telehealth: Payer: Self-pay | Admitting: Oncology

## 2014-06-18 ENCOUNTER — Encounter: Payer: Self-pay | Admitting: Internal Medicine

## 2014-06-18 VITALS — BP 120/70 | HR 87 | Temp 98.7°F | Ht 64.5 in | Wt 166.0 lb

## 2014-06-18 DIAGNOSIS — Z7952 Long term (current) use of systemic steroids: Secondary | ICD-10-CM

## 2014-06-18 DIAGNOSIS — M1710 Unilateral primary osteoarthritis, unspecified knee: Secondary | ICD-10-CM

## 2014-06-18 DIAGNOSIS — Z09 Encounter for follow-up examination after completed treatment for conditions other than malignant neoplasm: Secondary | ICD-10-CM

## 2014-06-18 DIAGNOSIS — M1712 Unilateral primary osteoarthritis, left knee: Secondary | ICD-10-CM

## 2014-06-18 DIAGNOSIS — D709 Neutropenia, unspecified: Secondary | ICD-10-CM

## 2014-06-18 DIAGNOSIS — M069 Rheumatoid arthritis, unspecified: Secondary | ICD-10-CM

## 2014-06-18 DIAGNOSIS — Z23 Encounter for immunization: Secondary | ICD-10-CM

## 2014-06-18 DIAGNOSIS — IMO0002 Reserved for concepts with insufficient information to code with codable children: Secondary | ICD-10-CM

## 2014-06-18 NOTE — Patient Instructions (Signed)
Glad you are doing better !. prevnar 13 today  Can get sore arm and low grade fever or achy but not very common to get this and goes away quickly. Disc with Dr Durenda Age the issues of prednisone  And dx of rheumatoid disease based on your symptoms .  Everything is a risk vs benefit of  Any medicine .  Dr Clelia Croft about medication  And evaluation for fevers in the future.

## 2014-06-18 NOTE — Progress Notes (Signed)
Pre visit review using our clinic review tool, if applicable. No additional management support is needed unless otherwise documented below in the visit note. 

## 2014-06-18 NOTE — Telephone Encounter (Signed)
Gave pt appt for lab and Md on 7/28 per Md

## 2014-06-18 NOTE — Progress Notes (Signed)
Chief Complaint  Patient presents with  . Hospital F/U    HPI: Patient comes in today as follow up from hospitalization for  Febrile neutropenia  Saw.  Was on levaquin  750.  Uncertain what to  Do with antibiotics.   Last wbc was 3.8 no fevers cough illness since hospitalization. Declined BM eval and count was better . Uncertain about a bug and sore throat.  About once a year.  Still on  Prednisone.  Would like to get off  Doc in hosp said she didn't have RA but OA of knee ? Scan in [place] showed ? Cirrhosis and poss enlarged spleen told to not worry about this  No bleeding etoh hepatitis hx .  ROS: See pertinent positives and negatives per HPI. No cp sob  No cough no bleed left knee pain is problematic  No falling Non to b neg etoh sig etc. Past Medical History  Diagnosis Date  . Depression   . Arthritis   . Neutropenia     Family History  Problem Relation Age of Onset  . Heart disease Sister   . Other Sister     Eye problems  . Other Sister     eye problems    History   Social History  . Marital Status: Married    Spouse Name: N/A    Number of Children: N/A  . Years of Education: N/A   Social History Main Topics  . Smoking status: Never Smoker   . Smokeless tobacco: Never Used  . Alcohol Use: No  . Drug Use: No  . Sexual Activity: None   Other Topics Concern  . None   Social History Narrative   Occupation: Self-employed   Married   Regular Exercise    Outpatient Encounter Prescriptions as of 06/18/2014  Medication Sig  . Ascorbic Acid (VITAMIN C) 1000 MG tablet Take 1,000 mg by mouth daily.    . Calcium Carbonate (CALCIUM 500 PO) Take by mouth.    . diphenhydramine-acetaminophen (TYLENOL PM) 25-500 MG TABS Patient takes 1/2 tablet at bedtime  . glucosamine-chondroitin 500-400 MG tablet Take 1 tablet by mouth 3 (three) times daily.    Marland Kitchen LORazepam (ATIVAN) 1 MG tablet TAKE ONE TABLET BY MOUTH THREE TIMES DAILY AS NEEDED FOR ANXIETY  . Multiple Vitamin  (MULITIVITAMIN WITH MINERALS) TABS Take 1 tablet by mouth daily.  . predniSONE (DELTASONE) 5 MG tablet Take 7.5 mg by mouth daily. 7mg  a day  . traMADol (ULTRAM) 50 MG tablet Take 50 mg by mouth every 6 (six) hours as needed.  CAPS Take 1 each by mouth daily.  [place] Lanius levofloxacin (LEVAQUIN) 750 MG tablet Take 1 tablet (750 mg total) by mouth daily.    EXAM:  BP 120/70  Pulse 87  Temp(Src) 98.7 F (37.1 C) (Oral)  Ht 5' 4.5" (1.638 m)  Wt 166 lb (75.297 kg)  BMI 28.06 kg/m2  Body mass index is 28.06 kg/(m^2).  GENERAL: vitals reviewed and listed above, alert, oriented, appears well hydrated and in no acute distress HEENT: atraumatic, conjunctiva  clear, no obvious abnormalities on inspection of external nose and ears OP : no lesion edema or exudate  NECK: no obvious masses on inspection palpation  LUNGS: clear to auscultation bilaterally, no wheezes, rales or rhonchi, good air movement CV: HRRR, no clubbing cyanosis or  peripheral edema nl cap refill  Abdomen:  Sof,t normal bowel sounds without hepatosplenomegaly hepar at rcm dec with inspiration, no guarding rebound or  masses no CVA tenderness MS: moves all extremities without noticeable focal  Abnormality  Finger changes arthritis  Pip and dip  PSYCH: pleasant and cooperative, no obvious depression or anxiety Lab Results  Component Value Date   WBC 3.8* 05/18/2014   HGB 10.8* 05/18/2014   HCT 32.8* 05/18/2014   PLT 159 05/18/2014   GLUCOSE 144* 05/15/2014   ALT 68* 05/15/2014   AST 54* 05/15/2014   NA 139 05/15/2014   K 3.0* 05/15/2014   CL 102 05/15/2014   CREATININE 0.98 05/15/2014   BUN 16 05/15/2014   CO2 23 05/15/2014    ASSESSMENT AND PLAN:  Discussed the following assessment and plan:  Neutropenia, unspecified  On prednisone therapy  Hospital discharge follow-up  Rheumatoid arthritis  Osteoarthritis of left knee, unspecified osteoarthritis type Stable fu dr s in a few weeks  Still neutropenia arthritis by past hx poss  ccp felt to be Ra but also has oa djd disc about poss having both. Agree treatment is problematic because of neutropenia  .  Advise disc pred with dr D  .  Also fever protocol with dr Kathie Rhodes. Disc prevnar 13  benefit more than risk  Given today and cdc vis given  -Patient advised to return or notify health care team  if symptoms worsen ,persist or new concerns arise.  Patient Instructions  Glad you are doing better !. prevnar 13 today  Can get sore arm and low grade fever or achy but not very common to get this and goes away quickly. Disc with Dr Durenda Age the issues of prednisone  And dx of rheumatoid disease based on your symptoms .  Everything is a risk vs benefit of  Any medicine .  Dr Clelia Croft about medication  And evaluation for fevers in the future.     Neta Mends. Tyrika Newman M.D.  Total visit > 50% spent counseling and coordinating care    About risk benefit of meds and findings   Dx and prednisone

## 2014-06-18 NOTE — Addendum Note (Signed)
Addended by: Raj Janus T on: 06/18/2014 02:42 PM   Modules accepted: Orders

## 2014-07-02 ENCOUNTER — Ambulatory Visit: Payer: Medicare Other | Admitting: Oncology

## 2014-07-02 ENCOUNTER — Other Ambulatory Visit: Payer: Medicare Other

## 2014-07-06 ENCOUNTER — Other Ambulatory Visit: Payer: Medicare Other

## 2014-07-06 ENCOUNTER — Ambulatory Visit: Payer: Medicare Other | Admitting: Oncology

## 2014-07-07 ENCOUNTER — Ambulatory Visit (HOSPITAL_BASED_OUTPATIENT_CLINIC_OR_DEPARTMENT_OTHER): Payer: Medicare Other | Admitting: Oncology

## 2014-07-07 ENCOUNTER — Other Ambulatory Visit (HOSPITAL_BASED_OUTPATIENT_CLINIC_OR_DEPARTMENT_OTHER): Payer: Medicare Other

## 2014-07-07 ENCOUNTER — Telehealth: Payer: Self-pay | Admitting: Oncology

## 2014-07-07 ENCOUNTER — Encounter: Payer: Self-pay | Admitting: Oncology

## 2014-07-07 ENCOUNTER — Other Ambulatory Visit: Payer: Self-pay | Admitting: Medical Oncology

## 2014-07-07 DIAGNOSIS — M1380 Other specified arthritis, unspecified site: Secondary | ICD-10-CM

## 2014-07-07 DIAGNOSIS — D72820 Lymphocytosis (symptomatic): Secondary | ICD-10-CM

## 2014-07-07 DIAGNOSIS — D709 Neutropenia, unspecified: Secondary | ICD-10-CM

## 2014-07-07 LAB — COMPREHENSIVE METABOLIC PANEL (CC13)
ALT: 11 U/L (ref 0–55)
ANION GAP: 8 meq/L (ref 3–11)
AST: 20 U/L (ref 5–34)
Albumin: 3.3 g/dL — ABNORMAL LOW (ref 3.5–5.0)
Alkaline Phosphatase: 44 U/L (ref 40–150)
BILIRUBIN TOTAL: 0.32 mg/dL (ref 0.20–1.20)
BUN: 22.4 mg/dL (ref 7.0–26.0)
CO2: 28 mEq/L (ref 22–29)
Calcium: 9.3 mg/dL (ref 8.4–10.4)
Chloride: 103 mEq/L (ref 98–109)
Creatinine: 1.1 mg/dL (ref 0.6–1.1)
GLUCOSE: 113 mg/dL (ref 70–140)
Potassium: 4.2 mEq/L (ref 3.5–5.1)
Sodium: 139 mEq/L (ref 136–145)
Total Protein: 7.4 g/dL (ref 6.4–8.3)

## 2014-07-07 LAB — CBC WITH DIFFERENTIAL/PLATELET
BASO%: 0.8 % (ref 0.0–2.0)
Basophils Absolute: 0 10*3/uL (ref 0.0–0.1)
EOS%: 1.3 % (ref 0.0–7.0)
Eosinophils Absolute: 0 10*3/uL (ref 0.0–0.5)
HCT: 36.6 % (ref 34.8–46.6)
HGB: 11.8 g/dL (ref 11.6–15.9)
LYMPH%: 52.6 % — ABNORMAL HIGH (ref 14.0–49.7)
MCH: 27.4 pg (ref 25.1–34.0)
MCHC: 32.1 g/dL (ref 31.5–36.0)
MCV: 85.2 fL (ref 79.5–101.0)
MONO#: 0.5 10*3/uL (ref 0.1–0.9)
MONO%: 29.9 % — ABNORMAL HIGH (ref 0.0–14.0)
NEUT#: 0.3 10*3/uL — CL (ref 1.5–6.5)
NEUT%: 15.4 % — AB (ref 38.4–76.8)
Platelets: 165 10*3/uL (ref 145–400)
RBC: 4.3 10*6/uL (ref 3.70–5.45)
RDW: 14.7 % — ABNORMAL HIGH (ref 11.2–14.5)
WBC: 1.8 10*3/uL — AB (ref 3.9–10.3)
lymph#: 0.9 10*3/uL (ref 0.9–3.3)

## 2014-07-07 MED ORDER — AZITHROMYCIN 250 MG PO TABS: ORAL_TABLET | ORAL | Status: DC

## 2014-07-07 MED ORDER — LORAZEPAM 1 MG PO TABS: ORAL_TABLET | ORAL | Status: DC

## 2014-07-07 NOTE — Progress Notes (Signed)
Hematology and Oncology Follow Up Visit  Jill Moss [id number] 11/24/1941 73 y.o. 07/07/2014 1:10 PM    CC: Standley Brooking. Regis Bill, MD  Leary Roca, MD    Principle Diagnosis: This is a 73 year old female with chronic neutropenia with differential diagnosis:  Autoimmune versus idiopathic causes versus a hematological disorder. This was diagnosed about 10 years ago, had been followed here at the [facility] for the last 8 years or so.   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  Interim History:  Jill Moss presents today for a followup visit with her husband. Since her last visit, she reports no new complaints. She did not have any recurrent infections or another hospitalization since her last. She has not reported any fevers or chills. Overall Jill Moss is doing well now and has not really had any other complaints. Her appetite is excellent and feels back to baseline. She has not reported any headaches blurred vision double vision. Did not report a motor sensory neuropathy in her alteration of mental status. Is not reporting any chest pain cough or hemoptysis or hematemesis. She is not reporting any abdominal distention or early satiety. She does not report any frequency urgency or hesitancy. The rest of review of systems unremarkable.  Medications: I have reviewed the patient's current medications.  Current Outpatient Prescriptions  Medication Sig Dispense Refill  . Ascorbic Acid (VITAMIN C) 1000 MG tablet Take 1,000 mg by mouth daily.        Marland Kitchen azithromycin (ZITHROMAX Z-PAK) 250 MG tablet Take two tablets on the first day. Then one tablet daily for 4 days.  6 each  0  . Calcium Carbonate (CALCIUM 500 PO) Take by mouth.        . diphenhydramine-acetaminophen (TYLENOL PM) 25-500 MG TABS Patient takes 1/2 tablet at bedtime      . glucosamine-chondroitin 500-400 MG tablet Take 1 tablet by mouth 3 (three) times daily.        Marland Kitchen  levofloxacin (LEVAQUIN) 750 MG tablet Take 1 tablet (750 mg total) by mouth daily.  8 tablet  0  . LORazepam (ATIVAN) 1 MG tablet TAKE ONE TABLET BY MOUTH THREE TIMES DAILY AS NEEDED FOR ANXIETY  270 tablet  0  . Multiple Vitamin (MULITIVITAMIN WITH MINERALS) TABS Take 1 tablet by mouth daily.      . predniSONE (DELTASONE) 5 MG tablet Take 7.5 mg by mouth daily. $RemoveBefo'7mg'AdDyPOmBViI$  a day       No current facility-administered medications for this visit.    Allergies:  Allergies  Allergen Reactions  . Amoxicillin   . Augmentin [Amoxicillin-Pot Clavulanate] Nausea And Vomiting    Stomach cramping    Past Medical History, Surgical history, Social history, and Family History were reviewed and updated.  Physical Exam: There were no vitals taken for this visit. ECOG: 0 General appearance: alert awake not in any distress Head: Normocephalic, without obvious abnormality, atraumatic Neck: no adenopathy, no thyroid masses Lymph nodes: Cervical, supraclavicular, and axillary nodes normal. Heart:regular rate and rhythm, S1, S2 normal, no murmur, click, rub or gallop Lung:chest clear, no wheezing, rales, normal symmetric air entry Abdomin: soft, non-tender, without masses or organomegaly EXT:no erythema, induration, or nodules   Lab Results: Lab Results  Component Value Date   WBC 1.8* 07/07/2014   HGB 11.8 07/07/2014   HCT 36.6 07/07/2014   MCV 85.2 07/07/2014   PLT 165 07/07/2014     Chemistry      Component Value  Date/Time   NA 139 07/07/2014 1207   NA 139 05/15/2014 0511   K 4.2 07/07/2014 1207   K 3.0* 05/15/2014 0511   CL 102 05/15/2014 0511   CL 101 02/06/2013 1117   CO2 28 07/07/2014 1207   CO2 23 05/15/2014 0511   BUN 22.4 07/07/2014 1207   BUN 16 05/15/2014 0511   CREATININE 1.1 07/07/2014 1207   CREATININE 0.98 05/15/2014 0511      Component Value Date/Time   CALCIUM 9.3 07/07/2014 1207   CALCIUM 8.4 05/15/2014 0511   ALKPHOS 44 07/07/2014 1207   ALKPHOS 59 05/15/2014 0511   AST 20 07/07/2014 1207    AST 54* 05/15/2014 0511   ALT 11 07/07/2014 1207   ALT 68* 05/15/2014 0511   BILITOT 0.32 07/07/2014 1207   BILITOT 0.3 05/15/2014 0511       Impression and Plan: A 73 year old female with the following issues:  1. Neutropenia and lymphocytosis. She is status post recent episode of neutropenic fever. The differential diagnosis was discussed again with the patient and her husband. This includes myelodysplastic syndrome and acute leukemia. Her clinical status today how much improved and probably less likely represents acute leukemia but certainly not completely ruled out. Her hemoglobin and platelets have normalized which goes against acute process. She feels that she doesn't really want to know at this time in a long as she is feeling well she would like to continue with the current course of action. I continue to recommend a bone marrow biopsy for her and for the time being she has declined. I continue to advise her to take a Z-Pak if she develops any flulike symptoms or any early infection signs. 2. Autoimmune arthritis.  She is currently on a slow prednisone taper. 3. Insomnia and anxiety: on Ativan. It seems to be manageable. 4. Increased liver function tests: CT scan from 05/13/2014 was reviewed and potentially showed early signs of cirrhosis which could be a contributing factor. Her liver function tests today appear to be normal.  Followup: Will be in January of 2016 sooner if needed to.    Zola Button, MD 7/29/20151:10 PM

## 2014-07-07 NOTE — Telephone Encounter (Signed)
Pt confirmed labs/ov per 07/29 POF, gave pt AVS...KJ °

## 2014-07-09 ENCOUNTER — Other Ambulatory Visit: Payer: Medicare Other

## 2014-07-09 ENCOUNTER — Ambulatory Visit: Payer: Medicare Other | Admitting: Oncology

## 2014-07-14 ENCOUNTER — Other Ambulatory Visit: Payer: Medicare Other

## 2014-07-14 ENCOUNTER — Ambulatory Visit: Payer: Medicare Other | Admitting: Oncology

## 2014-10-06 ENCOUNTER — Other Ambulatory Visit: Payer: Self-pay | Admitting: Oncology

## 2014-10-06 NOTE — Telephone Encounter (Signed)
OK-Dr. Clelia Moss will continue to refill this for her.

## 2014-12-25 ENCOUNTER — Telehealth: Payer: Self-pay | Admitting: Oncology

## 2014-12-25 NOTE — Telephone Encounter (Signed)
lm for pt with family member regarding to time change of appt....mailed pt appt sched and letter

## 2015-01-04 ENCOUNTER — Other Ambulatory Visit: Payer: Self-pay | Admitting: Oncology

## 2015-01-04 NOTE — Telephone Encounter (Signed)
Per dr Clelia Croft, okay to refill ativan, called to patient's pharmacy.

## 2015-01-04 NOTE — Telephone Encounter (Signed)
Note to dr Alver Fisher desk for refill on ativan.

## 2015-02-10 ENCOUNTER — Ambulatory Visit (HOSPITAL_BASED_OUTPATIENT_CLINIC_OR_DEPARTMENT_OTHER): Payer: Medicare Other | Admitting: Oncology

## 2015-02-10 ENCOUNTER — Telehealth: Payer: Self-pay | Admitting: Oncology

## 2015-02-10 ENCOUNTER — Other Ambulatory Visit (HOSPITAL_BASED_OUTPATIENT_CLINIC_OR_DEPARTMENT_OTHER): Payer: Medicare Other

## 2015-02-10 VITALS — BP 114/74 | HR 84 | Temp 97.7°F | Resp 18 | Ht 64.5 in | Wt 174.1 lb

## 2015-02-10 DIAGNOSIS — D72820 Lymphocytosis (symptomatic): Secondary | ICD-10-CM

## 2015-02-10 DIAGNOSIS — D709 Neutropenia, unspecified: Secondary | ICD-10-CM

## 2015-02-10 LAB — COMPREHENSIVE METABOLIC PANEL (CC13)
ALT: 13 U/L (ref 0–55)
ANION GAP: 10 meq/L (ref 3–11)
AST: 19 U/L (ref 5–34)
Albumin: 3.5 g/dL (ref 3.5–5.0)
Alkaline Phosphatase: 49 U/L (ref 40–150)
BUN: 24 mg/dL (ref 7.0–26.0)
CALCIUM: 9.3 mg/dL (ref 8.4–10.4)
CHLORIDE: 104 meq/L (ref 98–109)
CO2: 29 meq/L (ref 22–29)
Creatinine: 1.2 mg/dL — ABNORMAL HIGH (ref 0.6–1.1)
EGFR: 44 mL/min/{1.73_m2} — ABNORMAL LOW (ref >90)
GLUCOSE: 101 mg/dL (ref 70–140)
Potassium: 4.2 mEq/L (ref 3.5–5.1)
Sodium: 143 mEq/L (ref 136–145)
TOTAL PROTEIN: 6.9 g/dL (ref 6.4–8.3)
Total Bilirubin: 0.42 mg/dL (ref 0.20–1.20)

## 2015-02-10 LAB — CBC WITH DIFFERENTIAL/PLATELET
BASO%: 0.4 % (ref 0.0–2.0)
Basophils Absolute: 0 10*3/uL (ref 0.0–0.1)
EOS%: 2 % (ref 0.0–7.0)
Eosinophils Absolute: 0.1 10*3/uL (ref 0.0–0.5)
HCT: 39.4 % (ref 34.8–46.6)
HGB: 12.8 g/dL (ref 11.6–15.9)
LYMPH#: 1.3 10*3/uL (ref 0.9–3.3)
LYMPH%: 50 % — ABNORMAL HIGH (ref 14.0–49.7)
MCH: 28.6 pg (ref 25.1–34.0)
MCHC: 32.5 g/dL (ref 31.5–36.0)
MCV: 87.9 fL (ref 79.5–101.0)
MONO#: 0.5 10*3/uL (ref 0.1–0.9)
MONO%: 20.4 % — ABNORMAL HIGH (ref 0.0–14.0)
NEUT#: 0.7 10*3/uL — ABNORMAL LOW (ref 1.5–6.5)
NEUT%: 27.2 % — ABNORMAL LOW (ref 38.4–76.8)
Platelets: 156 10*3/uL (ref 145–400)
RBC: 4.48 10*6/uL (ref 3.70–5.45)
RDW: 14.5 % (ref 11.2–14.5)
WBC: 2.5 10*3/uL — ABNORMAL LOW (ref 3.9–10.3)

## 2015-02-10 MED ORDER — AZITHROMYCIN 250 MG PO TABS: ORAL_TABLET | ORAL | Status: DC

## 2015-02-10 NOTE — Progress Notes (Signed)
Hematology and Oncology Follow Up Visit  Jill Moss [id number] Jun 03, 1941 74 y.o. 02/10/2015 9:53 AM    CC: Jill Brooking. Regis Bill, MD  Jill Roca, MD    Principle Diagnosis: This is a 74 year old female with chronic neutropenia with differential diagnosis:  Autoimmune versus idiopathic causes versus a hematological disorder. This was diagnosed about 10 years ago, had been followed here at the [facility] for the last 8 years or so.   Current Therapy:  On prednisone 10 mg daily for autoimmune arthritis.  Interim History:  [name] presents today for a followup visit with her husband. Since her last visit, she reports no new complaints. She is more depressed at this time since the last visit. She had a trip to [place] and had a flareup of her arthritis. She was prescribed a prednisone at 20 mg and was titrated down to 10 mg. She reports her mood I will gone worse recently and she takes Ativan as needed. She has seen a psychiatrist in the past and refuses to see any mental health professional at this time. She did not have any recurrent infections or another hospitalization since her last visit. She has not reported any fevers or chills. Her appetite is excellent and feels back to baseline. She has not reported any headaches blurred vision double vision. Did not report a motor sensory neuropathy in her alteration of mental status. Is not reporting any chest pain cough or hemoptysis or hematemesis. She is not reporting any abdominal distention or early satiety. She does not report any frequency urgency or hesitancy. The rest of review of systems unremarkable.  Medications: I have reviewed the patient's current medications.  Current Outpatient Prescriptions  Medication Sig Dispense Refill  . Ascorbic Acid (VITAMIN C) 1000 MG tablet Take 1,000 mg by mouth daily.      Marland Kitchen azithromycin (ZITHROMAX Z-PAK) 250 MG tablet Take two tablets on the first day. Then one tablet  daily for 4 days. 6 each 1  . Calcium Carbonate (CALCIUM 500 PO) Take by mouth.      . diphenhydramine-acetaminophen (TYLENOL PM) 25-500 MG TABS Patient takes 1/2 tablet at bedtime    . glucosamine-chondroitin 500-400 MG tablet Take 1 tablet by mouth 3 (three) times daily.      Marland Kitchen levofloxacin (LEVAQUIN) 750 MG tablet Take 1 tablet (750 mg total) by mouth daily. 8 tablet 0  . LORazepam (ATIVAN) 1 MG tablet TAKE 1 TABLET BY MOUTH THREE TIMES DAILY AS NEEDED FOR ANXIETY 270 tablet 0  . Multiple Vitamin (MULITIVITAMIN WITH MINERALS) TABS Take 1 tablet by mouth daily.    . predniSONE (DELTASONE) 5 MG tablet Take 7.5 mg by mouth daily. 59m a day     No current facility-administered medications for this visit.    Allergies:  Allergies  Allergen Reactions  . Amoxicillin   . Augmentin [Amoxicillin-Pot Clavulanate] Nausea And Vomiting    Stomach cramping    Past Medical History, Surgical history, Social history, and Family History were reviewed and updated.  Physical Exam: Blood pressure 114/74, pulse 84, temperature 97.7 F (36.5 C), temperature source Oral, resp. rate 18, height 5' 4.5" (1.638 m), weight 174 lb 1.6 oz (78.971 kg), SpO2 100 %. ECOG: 0 General appearance: alert awake not in any distress Head: Normocephalic, without obvious abnormality, atraumatic Neck: no adenopathy, no thyroid masses Lymph nodes: Cervical, supraclavicular, and axillary nodes normal. Heart:regular rate and rhythm, S1, S2 normal, no murmur, click, rub or gallop Lung:chest clear, no wheezing, rales,  normal symmetric air entry Abdomin: soft, non-tender, without masses or organomegaly EXT:no erythema, induration, or nodules   Lab Results: Lab Results  Component Value Date   WBC 2.5* 02/10/2015   HGB 12.8 02/10/2015   HCT 39.4 02/10/2015   MCV 87.9 02/10/2015   PLT 156 02/10/2015     Chemistry      Component Value Date/Time   NA 139 07/07/2014 1207   NA 139 05/15/2014 0511   K 4.2 07/07/2014 1207    K 3.0* 05/15/2014 0511   CL 102 05/15/2014 0511   CL 101 02/06/2013 1117   CO2 28 07/07/2014 1207   CO2 23 05/15/2014 0511   BUN 22.4 07/07/2014 1207   BUN 16 05/15/2014 0511   CREATININE 1.1 07/07/2014 1207   CREATININE 0.98 05/15/2014 0511      Component Value Date/Time   CALCIUM 9.3 07/07/2014 1207   CALCIUM 8.4 05/15/2014 0511   ALKPHOS 44 07/07/2014 1207   ALKPHOS 59 05/15/2014 0511   AST 20 07/07/2014 1207   AST 54* 05/15/2014 0511   ALT 11 07/07/2014 1207   ALT 68* 05/15/2014 0511   BILITOT 0.32 07/07/2014 1207   BILITOT 0.3 05/15/2014 0511       Impression and Plan: A 74 year old female with the following issues:  1. Neutropenia and lymphocytosis.  The differential diagnosis was discussed again. This includes autoimmune neutropenia as well as myelodysplastic syndrome and acute leukemia. Her clinical status is stable at this time and does not indicate any acute hematological emergency. Her white cell count is actually improved since the last visit and her [place] is about 500. Her hemoglobin and platelets have normalized which goes against acute process. I continue to recommend a bone marrow biopsy for her and for the time being she has declined. I continue to advise her to take a Z-Pak if she develops any flulike symptoms or any early infection signs. 2. Autoimmune arthritis.  She is currently on a slow prednisone taper. 3. Insomnia and anxiety: on Ativan. It seems to be manageable. 4. Depression: She requested a different benzodiazepine and I have recommended that she sees her primary care physician or psychiatrist regarding her mental health issues. Although she is depressed she certainly not suicidal or homicidal at this time. She reports that she has been through these episodes before and she manages them independently without seeing a health professional. I advised her to seek professional help and I offered her referral to psychiatry urgently and she  declined. 5. Follow-up: Will be on an annual basis moving forward.    [facility], MD 3/3/20169:53 AM

## 2015-02-10 NOTE — Telephone Encounter (Signed)
Gave avs & calendar for March 2017. °

## 2015-02-10 NOTE — Addendum Note (Signed)
Addended by: Reesa Chew on: 02/10/2015 10:00 AM   Modules accepted: Medications

## 2015-03-04 ENCOUNTER — Other Ambulatory Visit: Payer: Self-pay | Admitting: Oncology

## 2015-03-04 ENCOUNTER — Other Ambulatory Visit: Payer: Self-pay | Admitting: *Deleted

## 2015-03-04 MED ORDER — LORAZEPAM 1 MG PO TABS: 1.0000 mg | ORAL_TABLET | Freq: Three times a day (TID) | ORAL | Status: DC | PRN

## 2015-04-18 ENCOUNTER — Telehealth: Payer: Self-pay | Admitting: Internal Medicine

## 2015-04-18 NOTE — Telephone Encounter (Signed)
Looks like pt needs Pneumovax and Shingles.  Please check behind me.

## 2015-04-18 NOTE — Telephone Encounter (Signed)
Patient would like to know if she is current on her immunizations?  She states you can leave a detailed message on voicemail if needed.

## 2015-04-18 NOTE — Telephone Encounter (Signed)
Yes  The pneumovax was 2 months before her 74 bday  Too early to meet requirement so should get another pneumovax  .  Shingles  Rest is utd.

## 2015-04-19 NOTE — Telephone Encounter (Signed)
Spoke to the pt's husband.  He stated that the pt spoke to someone yesterday and she was informed that she needed the pneumo vaccine.  Unsure who she spoke to.

## 2015-04-21 ENCOUNTER — Telehealth: Payer: Self-pay | Admitting: *Deleted

## 2015-04-21 ENCOUNTER — Other Ambulatory Visit: Payer: Self-pay | Admitting: Oncology

## 2015-04-21 NOTE — Telephone Encounter (Signed)
Left messages on home and mobile phone. Need to know which drug store patient would like her ativan called into. [facility] [address], has a disconnected #

## 2015-04-22 ENCOUNTER — Encounter: Payer: Self-pay | Admitting: *Deleted

## 2015-04-22 NOTE — Telephone Encounter (Signed)
Kayin returned call.  Does not wish to change pharmacy.  Verified pharmacy name, address and phone number with this nurse.  Successfully called [facility] voicemail with refill information at this time.

## 2015-09-14 ENCOUNTER — Other Ambulatory Visit: Payer: Self-pay | Admitting: Oncology

## 2015-09-15 ENCOUNTER — Other Ambulatory Visit: Payer: Self-pay | Admitting: *Deleted

## 2015-09-15 MED ORDER — LORAZEPAM 1 MG PO TABS: 1.0000 mg | ORAL_TABLET | Freq: Three times a day (TID) | ORAL | Status: DC | PRN

## 2015-12-15 ENCOUNTER — Other Ambulatory Visit: Payer: Self-pay | Admitting: Oncology

## 2015-12-16 ENCOUNTER — Encounter: Payer: Self-pay | Admitting: *Deleted

## 2015-12-16 NOTE — Progress Notes (Signed)
Called in refill script for ativan to [facility] in [place]., per dr Clelia Croft. Printed copy in  shredder, witnessed by [employer]

## 2016-02-09 ENCOUNTER — Other Ambulatory Visit: Payer: Medicare Other

## 2016-02-09 ENCOUNTER — Ambulatory Visit: Payer: Medicare Other | Admitting: Oncology

## 2016-02-20 ENCOUNTER — Telehealth: Payer: Self-pay | Admitting: Oncology

## 2016-02-20 NOTE — Telephone Encounter (Signed)
pt cld to r/s appt-gave r/s appt time & date °

## 2016-03-11 IMAGING — CT CT CHEST W/O CM
2 of 4 series · 15 of 36 positions shown, 18 images · non-contrast
Comparison: Current chest radiograph.

CLINICAL DATA: Fever, chills.  Abnormal chest radiograph.

EXAM:
CT CHEST WITHOUT CONTRAST
TECHNIQUE: Multidetector CT imaging of the chest was performed following the
standard protocol without IV contrast..

[Series 2: thorax 5.0 i31f 1 · axial · 0.75mm/px · z∈[-217,+73]mm · 12 of 68 slices shown, 15 images]
[im 5/68  mediastinal]
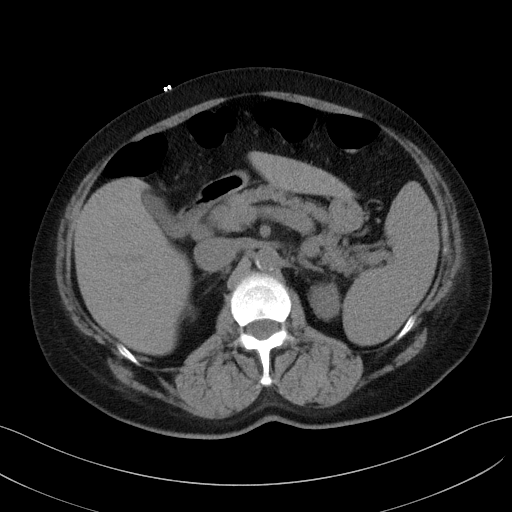
[im 5/68  lung]
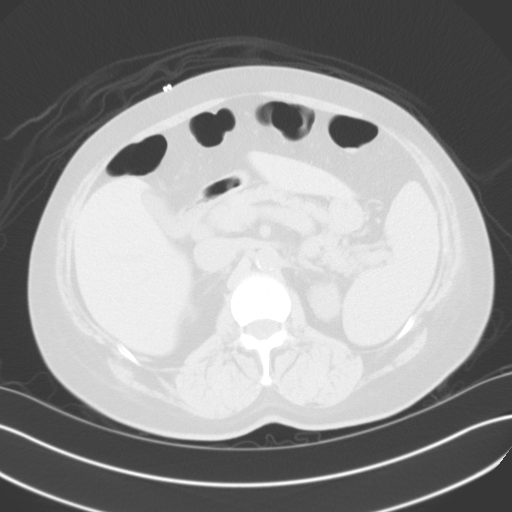
[im 10/68  lung]
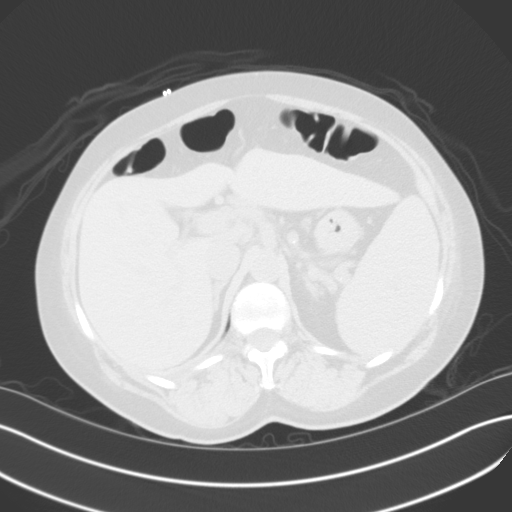
[im 15/68  lung]
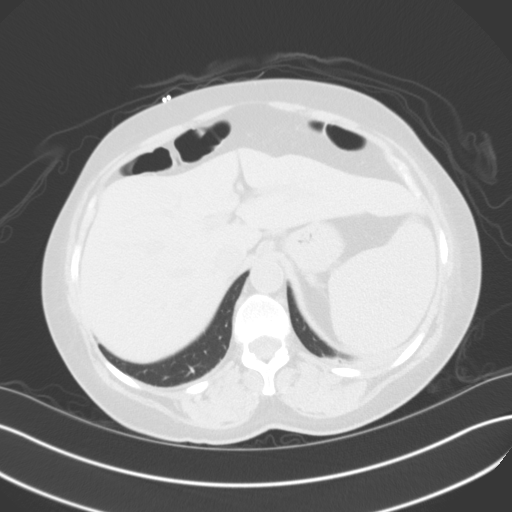
[im 20/68  lung]
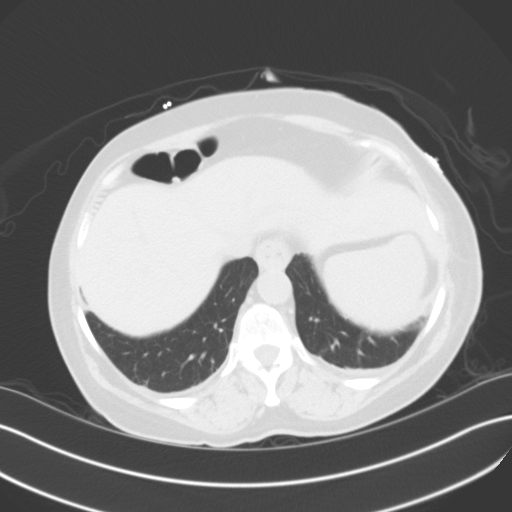
[im 24/68  mediastinal]
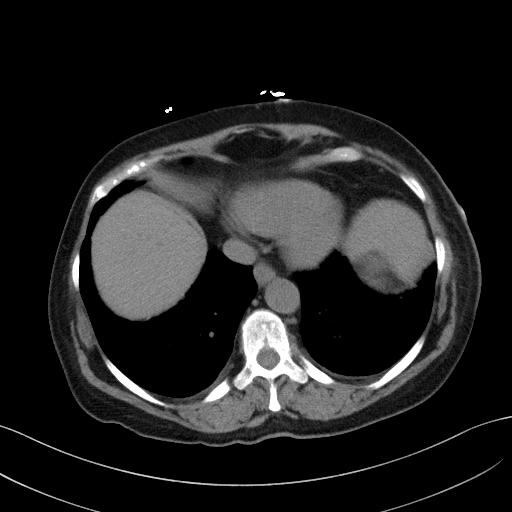
[im 24/68  lung]
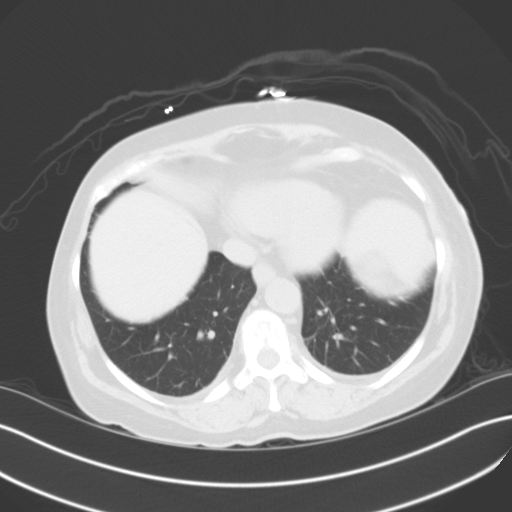
[im 29/68  lung]
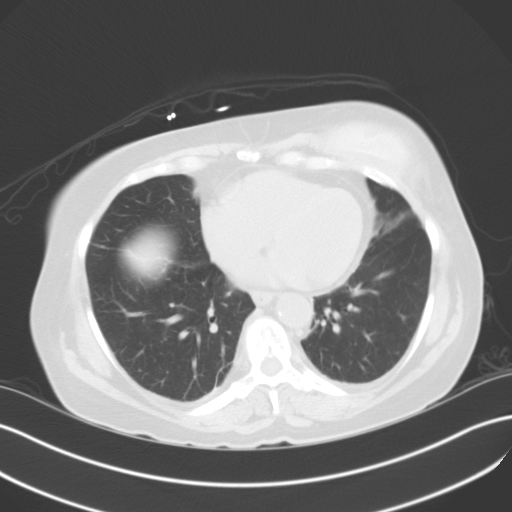
[im 39/68  lung]
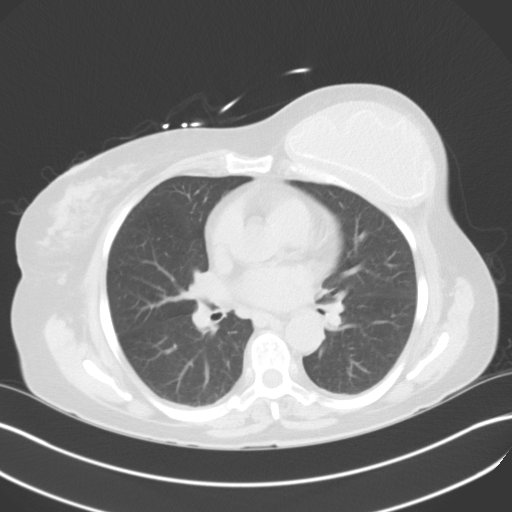
[im 44/68  lung]
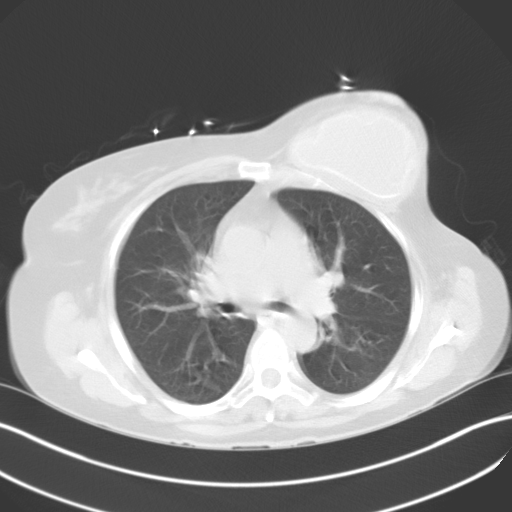
[im 48/68  mediastinal]
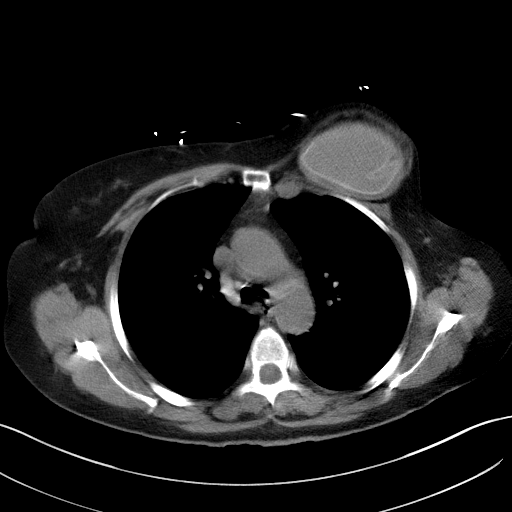
[im 48/68  lung]
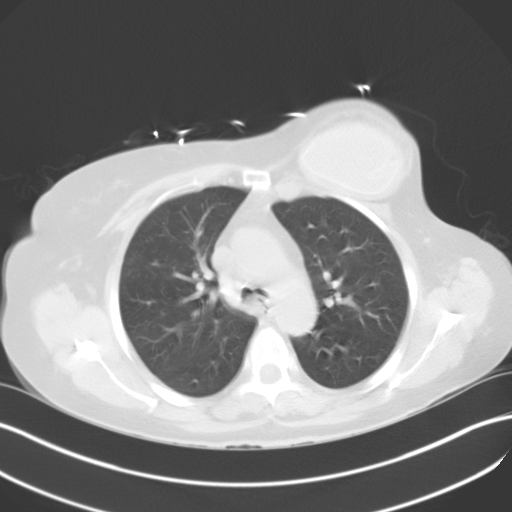
[im 53/68  lung]
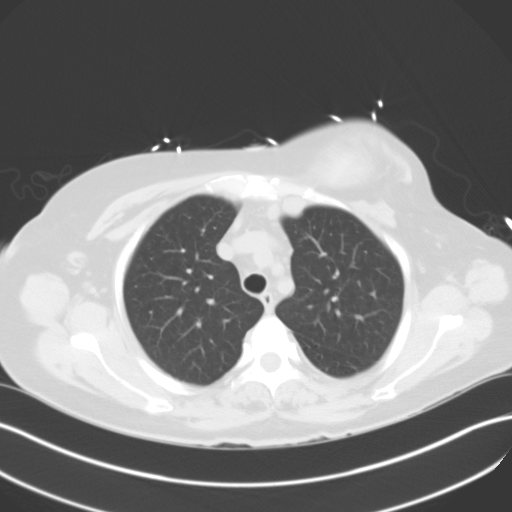
[im 58/68  lung]
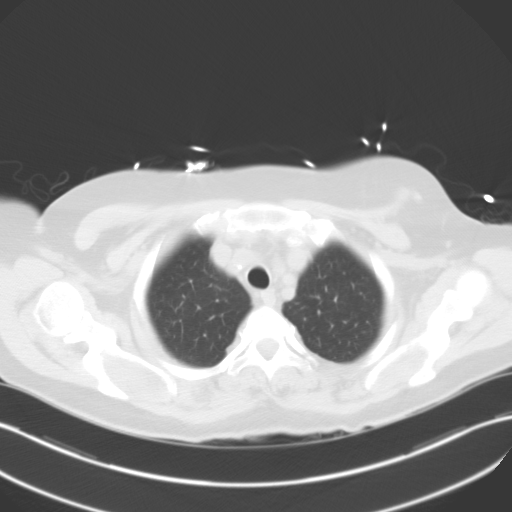
[im 63/68  lung]
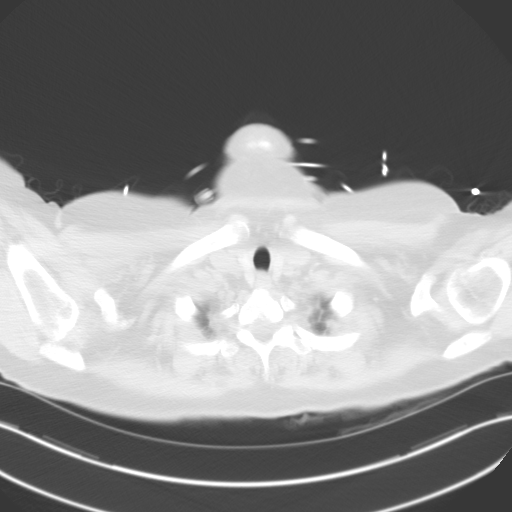

[Series 4: coronal · coronal · 0.66mm/px · 3 of 126 slices shown]
[im 26/126  lung]
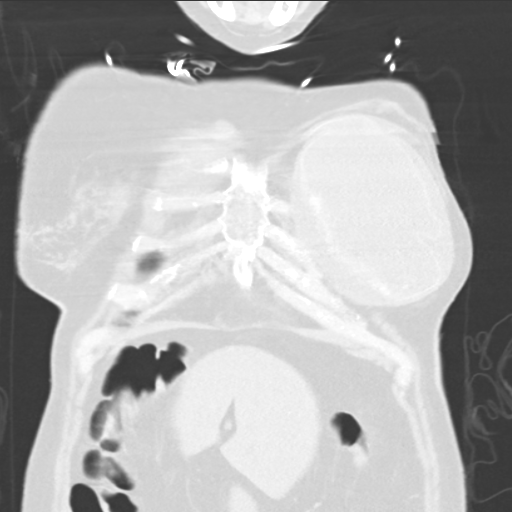
[im 51/126  lung]
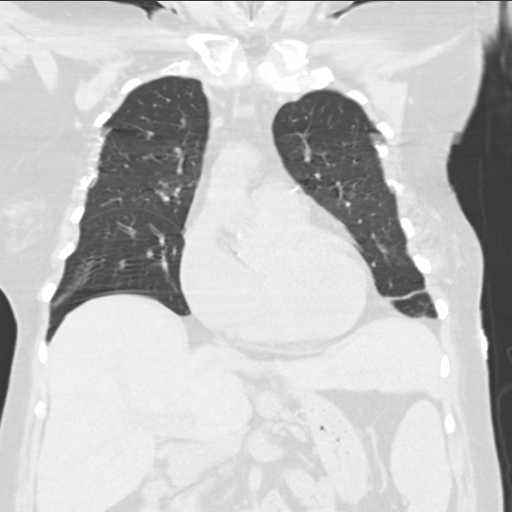
[im 76/126  lung]
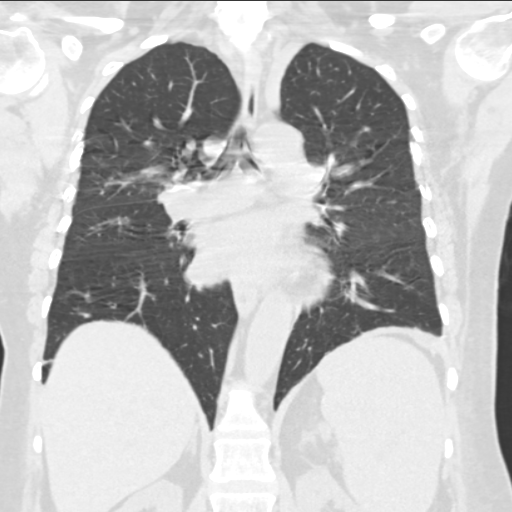

[15 of 36 positions shown; findings below may reference images not displayed]

FINDINGS: There is a left breast prosthesis. This accounts for the increased
opacity projecting in the left hemi thorax on the chest radiograph.

Left lobe of the thyroid gland is prominent extending to the level
of the jugular notch. No discrete nodule. No neck base or axillary
masses or enlarged lymph nodes.

There is left internal mammary adenopathy. Two discrete nodes are
measured both 11 mm in short axis. There is an adjacent prominent
anterior mediastinal lymph node measuring 9 mm in short axis. No
other mediastinal adenopathy. Heart is normal in size. Great vessels
are normal in caliber. No hilar masses or adenopathy.

Minor subsegmental atelectasis noted at the lung bases. No lung
consolidation or edema. Minimal scarring noted at the apices. No
pleural effusion.

Limited evaluation of the upper abdomen shows borderline
splenomegaly. There morphologic changes of the liver raising
suspicion for cirrhosis. There is gastrohepatic ligament adenopathy.
The largest node measures 15 mm in short axis.

Mild degenerative changes are noted along the thoracic spine. No
osteoblastic or osteolytic lesions.
IMPRESSION: 1. Opacity projecting in the left hemi thorax on the recent chest
radiographs is due to an overlying left breast prosthesis, which is
from reconstruction following a breast surgery. Although there is no
mention of breast carcinoma and history this is suspected.
2. There is internal mammary chain adenopathy, with 2 nodes
measuring 11 mm in short axis. There is 1 adjacent prominent
anterior mediastinal lymph node.
3. Minor subsegmental atelectasis in the lungs and scarring at the
apices. Lungs are otherwise clear. No evidence of pneumonia.
4. Findings in the upper abdomen are consistent with cirrhosis
borderline splenomegaly. Mild gastrohepatic ligament adenopathy is
likely reactive.

## 2016-03-11 IMAGING — CR DG CHEST 1V PORT
1 series · 1 of 1 positions shown · non-contrast
Comparison: 05/12/2014.

CLINICAL DATA: temp 102.?infection abnormal chest radiograph
yesterday.

EXAM:
PORTABLE CHEST - 1 VIEW

[AP]
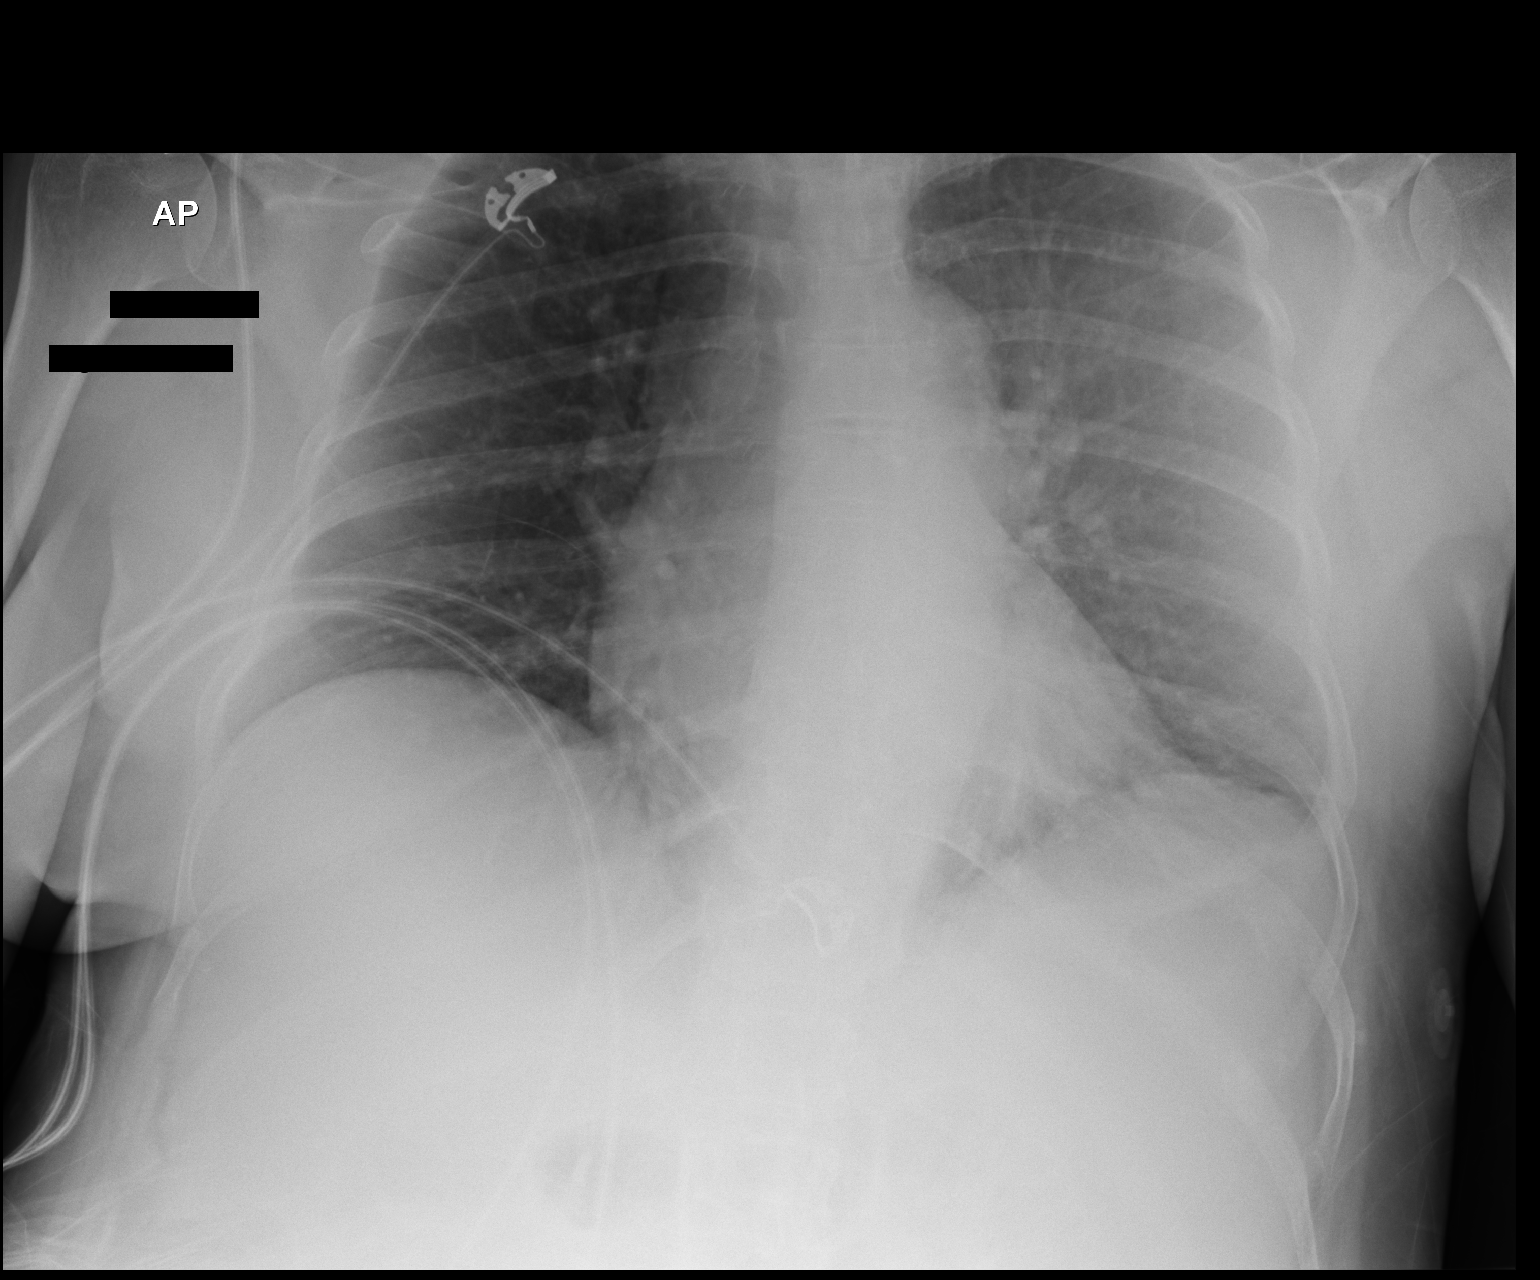

[1 of 1 positions shown; findings below may reference images not displayed]

FINDINGS: There is persistent haziness over the left chest which is unchanged
compared to yesterday. No focal consolidation. Left basilar
atelectasis. There is no lateral view submitted to further assess
the density at the left base. Apical lordotic projection is present
on today's exam. Cardiopericardial silhouette appears within normal
limits for projection. Monitoring leads project over the chest.
IMPRESSION: Persistent unchanged hazy opacity over the left lung base. As noted
on yesterday's exam, a lateral view would be helpful in localizing
the density over the left lung base and further characterizing. This
may represent an atypical presentation of pneumonia including
atypical agents in this neutropenic patient.

## 2016-03-14 ENCOUNTER — Telehealth: Payer: Self-pay | Admitting: Oncology

## 2016-03-14 NOTE — Telephone Encounter (Signed)
Call day - moved 4/6 appointments ti 4/14. Left message for patient on both home and cell re change and new date/time.

## 2016-03-15 ENCOUNTER — Other Ambulatory Visit: Payer: Medicare Other

## 2016-03-15 ENCOUNTER — Ambulatory Visit: Payer: Medicare Other | Admitting: Oncology

## 2016-03-22 ENCOUNTER — Other Ambulatory Visit: Payer: Self-pay | Admitting: *Deleted

## 2016-03-22 DIAGNOSIS — D709 Neutropenia, unspecified: Secondary | ICD-10-CM

## 2016-03-23 ENCOUNTER — Other Ambulatory Visit (HOSPITAL_BASED_OUTPATIENT_CLINIC_OR_DEPARTMENT_OTHER): Payer: Medicare Other

## 2016-03-23 ENCOUNTER — Telehealth: Payer: Self-pay | Admitting: Oncology

## 2016-03-23 ENCOUNTER — Ambulatory Visit (HOSPITAL_BASED_OUTPATIENT_CLINIC_OR_DEPARTMENT_OTHER): Payer: Medicare Other | Admitting: Oncology

## 2016-03-23 VITALS — BP 120/59 | HR 86 | Temp 98.2°F | Resp 18 | Ht 64.5 in | Wt 179.5 lb

## 2016-03-23 DIAGNOSIS — G4700 Insomnia, unspecified: Secondary | ICD-10-CM

## 2016-03-23 DIAGNOSIS — D72820 Lymphocytosis (symptomatic): Secondary | ICD-10-CM

## 2016-03-23 DIAGNOSIS — F419 Anxiety disorder, unspecified: Secondary | ICD-10-CM | POA: Diagnosis not present

## 2016-03-23 DIAGNOSIS — D709 Neutropenia, unspecified: Secondary | ICD-10-CM | POA: Diagnosis not present

## 2016-03-23 DIAGNOSIS — F329 Major depressive disorder, single episode, unspecified: Secondary | ICD-10-CM

## 2016-03-23 LAB — CBC WITH DIFFERENTIAL/PLATELET
BASO%: 0.2 % (ref 0.0–2.0)
Basophils Absolute: 0 10*3/uL (ref 0.0–0.1)
EOS%: 0.9 % (ref 0.0–7.0)
Eosinophils Absolute: 0 10*3/uL (ref 0.0–0.5)
HEMATOCRIT: 37.8 % (ref 34.8–46.6)
HGB: 12.3 g/dL (ref 11.6–15.9)
LYMPH#: 0.6 10*3/uL — AB (ref 0.9–3.3)
LYMPH%: 13.3 % — ABNORMAL LOW (ref 14.0–49.7)
MCH: 29.6 pg (ref 25.1–34.0)
MCHC: 32.5 g/dL (ref 31.5–36.0)
MCV: 90.9 fL (ref 79.5–101.0)
MONO#: 0.4 10*3/uL (ref 0.1–0.9)
MONO%: 9.1 % (ref 0.0–14.0)
NEUT%: 76.5 % (ref 38.4–76.8)
NEUTROS ABS: 3.3 10*3/uL (ref 1.5–6.5)
Platelets: 187 10*3/uL (ref 145–400)
RBC: 4.16 10*6/uL (ref 3.70–5.45)
RDW: 13.4 % (ref 11.2–14.5)
WBC: 4.3 10*3/uL (ref 3.9–10.3)

## 2016-03-23 MED ORDER — LORAZEPAM 1 MG PO TABS: 1.0000 mg | ORAL_TABLET | Freq: Three times a day (TID) | ORAL | Status: DC | PRN

## 2016-03-23 MED ORDER — AZITHROMYCIN 250 MG PO TABS: ORAL_TABLET | ORAL | Status: AC

## 2016-03-23 NOTE — Telephone Encounter (Signed)
Gave and printed appt sched and avs for pt for April 2018 °

## 2016-03-23 NOTE — Progress Notes (Signed)
Hematology and Oncology Follow Up Visit  Jill Moss [id number] 1941/08/26 75 y.o. 03/23/2016 3:47 PM    CC: Jill Moss K. Jill Sharp, MD  Jill Hitch, MD    Principle Diagnosis: This is a 75 year old female with chronic neutropenia Likely related to utoimmune etiology versus idiopathic causes. This is chronic finding dating back to at least 2007. She also has history of rheumatoid arthritis.   Current Therapy:  On prednisone 10 mg to 15 mg daily for autoimmune arthritis.  Interim History:  Jill Moss presents today for a followup visit by herself. Since her last visit, she reports doing fairly well. She continues to have issues with rheumatoid arthritis and currently on prednisone at 12.5 mg daily. She seems to function reasonably well at this current dose.  She did not have any recurrent infections or another hospitalization since her last visit. She has not reported any fevers or chills. Her appetite is excellent and feels back to baseline. She travels periodically to the [place] to visit her family in planning a visit in the near future.   She has not reported any headaches blurred vision double vision. Did not report a motor sensory neuropathy in her alteration of mental status. Is not reporting any chest pain cough or hemoptysis or hematemesis. She is not reporting any abdominal distention or early satiety. She does not report any frequency urgency or hesitancy. The rest of review of systems unremarkable.  Medications: I have reviewed the patient's current medications.  Current Outpatient Prescriptions  Medication Sig Dispense Refill  . Ascorbic Acid (VITAMIN C) 1000 MG tablet Take 1,000 mg by mouth daily.      Marland Kitchen azithromycin (ZITHROMAX Z-PAK) 250 MG tablet Take two tablets on the first day. Then one tablet daily for 4 days. 6 each 1  . Calcium Carbonate (CALCIUM 500 PO) Take by mouth.      . diphenhydramine-acetaminophen (TYLENOL PM) 25-500 MG TABS Patient takes 1/2  tablet at bedtime    . glucosamine-chondroitin 500-400 MG tablet Take 1 tablet by mouth 3 (three) times daily.      Marland Kitchen LORazepam (ATIVAN) 1 MG tablet Take 1 tablet (1 mg total) by mouth 3 (three) times daily as needed. for anxiety 270 tablet 0  . Multiple Vitamin (MULITIVITAMIN WITH MINERALS) TABS Take 1 tablet by mouth daily.    . predniSONE (DELTASONE) 5 MG tablet Take 7.5 mg by mouth daily. 7mg  a day     No current facility-administered medications for this visit.    Allergies:  Allergies  Allergen Reactions  . Amoxicillin   . Augmentin [Amoxicillin-Pot Clavulanate] Nausea And Vomiting    Stomach cramping    Past Medical History, Surgical history, Social history, and Family History were reviewed and updated.  Physical Exam: Blood pressure 120/59, pulse 86, temperature 98.2 F (36.8 C), temperature source Oral, resp. rate 18, height 5' 4.5" (1.638 m), weight 179 lb 8 oz (81.421 kg), SpO2 100 %. ECOG: 0 General appearance: Alert, awake woman without distress. Head: Normocephalic, without obvious abnormality Neck: no adenopathy, no thyroid masses Lymph nodes: Cervical, supraclavicular, and axillary nodes normal. Heart:regular rate and rhythm, S1, S2 normal, no murmur, click, rub or gallop Lung:chest clear, no wheezing, rales, normal symmetric air entry Abdomin: soft, non-tender, without masses or organomegaly no shifting dullness or ascites. EXT:no erythema, induration, or nodules   Lab Results: Lab Results  Component Value Date   WBC 4.3 03/23/2016   HGB 12.3 03/23/2016   HCT 37.8 03/23/2016   MCV 90.9 03/23/2016  PLT 187 03/23/2016     Chemistry      Component Value Date/Time   NA 143 02/10/2015 0910   NA 139 05/15/2014 0511   K 4.2 02/10/2015 0910   K 3.0* 05/15/2014 0511   CL 102 05/15/2014 0511   CL 101 02/06/2013 1117   CO2 29 02/10/2015 0910   CO2 23 05/15/2014 0511   BUN 24.0 02/10/2015 0910   BUN 16 05/15/2014 0511   CREATININE 1.2* 02/10/2015 0910    CREATININE 0.98 05/15/2014 0511      Component Value Date/Time   CALCIUM 9.3 02/10/2015 0910   CALCIUM 8.4 05/15/2014 0511   ALKPHOS 49 02/10/2015 0910   ALKPHOS 59 05/15/2014 0511   AST 19 02/10/2015 0910   AST 54* 05/15/2014 0511   ALT 13 02/10/2015 0910   ALT 68* 05/15/2014 0511   BILITOT 0.42 02/10/2015 0910   BILITOT 0.3 05/15/2014 0511       Impression and Plan: A 75 year old female with the following issues:  1. Neutropenia and lymphocytosis. Her white cell count have completely normalized at this time with a white cell count of 4.3 and a normal differential. These findings suggest autoimmune etiology of her neutropenia that has improved with the use of prednisone. At this time, I do not recommend any further hematology testing and continued observation. I continue to advise her to take a Z-Pak if she develops symptoms or any early infection signs. 2. Autoimmune arthritis.  She is currently on a slow prednisone taper. She continues to follow with rheumatology regarding this issue. Given the fact that her white cell count have normalized, I am not opposed for her to receive methotrexate or other agents to allow her to taper off prednisone. 3. Insomnia and anxiety: on Ativan which seems to be effective at this time. 4. Depression: Her mood is improved at this time. 5. Follow-up: Will be on an annual basis moving forward.    [facility], MD 4/14/20173:47 PM

## 2016-05-16 ENCOUNTER — Other Ambulatory Visit: Payer: Self-pay | Admitting: *Deleted

## 2016-05-16 DIAGNOSIS — D709 Neutropenia, unspecified: Secondary | ICD-10-CM

## 2016-05-16 MED ORDER — LORAZEPAM 1 MG PO TABS: 1.0000 mg | ORAL_TABLET | Freq: Three times a day (TID) | ORAL | Status: AC | PRN

## 2016-06-15 ENCOUNTER — Telehealth: Payer: Self-pay

## 2016-06-15 NOTE — Telephone Encounter (Signed)
Patient is on the list for [employer] 2017 and may be a good candidate for an AWV in 2017. Please let me know if/when appt is scheduled.   

## 2016-09-13 ENCOUNTER — Ambulatory Visit: Payer: Self-pay | Admitting: Rheumatology

## 2017-03-25 ENCOUNTER — Telehealth: Payer: Self-pay | Admitting: Oncology

## 2017-03-25 NOTE — Telephone Encounter (Signed)
lvm to inform pt of r/s 4/2 app to 5/16 at 1230 due to MD 4/20  CME

## 2017-03-29 ENCOUNTER — Ambulatory Visit: Payer: Medicare Other | Admitting: Oncology

## 2017-03-29 ENCOUNTER — Other Ambulatory Visit: Payer: Medicare Other

## 2017-04-24 ENCOUNTER — Ambulatory Visit: Payer: Medicare Other | Admitting: Oncology

## 2017-04-24 ENCOUNTER — Other Ambulatory Visit: Payer: Medicare Other

## 2017-08-30 ENCOUNTER — Encounter: Payer: Self-pay | Admitting: Internal Medicine

## 2022-09-21 NOTE — Telephone Encounter (Signed)
Close encounter
